# Patient Record
Sex: Male | Born: 1970 | Race: Black or African American | Hispanic: No | Marital: Married | Smoking: Current every day smoker
Health system: Southern US, Community
[De-identification: ages and names within clinical notes are randomized; demographics above are authoritative.]

## PROBLEM LIST (undated history)

## (undated) DIAGNOSIS — I10 Essential (primary) hypertension: Secondary | ICD-10-CM

## (undated) HISTORY — PX: FOOT SURGERY: SHX648

---

## 2015-03-11 ENCOUNTER — Emergency Department: Payer: PRIVATE HEALTH INSURANCE

## 2015-03-11 ENCOUNTER — Encounter: Payer: Self-pay | Admitting: *Deleted

## 2015-03-11 ENCOUNTER — Emergency Department
Admission: EM | Admit: 2015-03-11 | Discharge: 2015-03-11 | Disposition: A | Discharge status: Home / Self Care | Payer: PRIVATE HEALTH INSURANCE | Attending: Student | Admitting: Student

## 2015-03-11 DIAGNOSIS — Y9289 Other specified places as the place of occurrence of the external cause: Secondary | ICD-10-CM | POA: Insufficient documentation

## 2015-03-11 DIAGNOSIS — S8001XA Contusion of right knee, initial encounter: Secondary | ICD-10-CM | POA: Insufficient documentation

## 2015-03-11 DIAGNOSIS — Y998 Other external cause status: Secondary | ICD-10-CM | POA: Diagnosis not present

## 2015-03-11 DIAGNOSIS — Y9355 Activity, bike riding: Secondary | ICD-10-CM | POA: Insufficient documentation

## 2015-03-11 DIAGNOSIS — F17200 Nicotine dependence, unspecified, uncomplicated: Secondary | ICD-10-CM | POA: Insufficient documentation

## 2015-03-11 DIAGNOSIS — S8991XA Unspecified injury of right lower leg, initial encounter: Secondary | ICD-10-CM | POA: Diagnosis present

## 2015-03-11 DIAGNOSIS — M25461 Effusion, right knee: Secondary | ICD-10-CM | POA: Diagnosis not present

## 2015-03-11 MED ORDER — KETOROLAC TROMETHAMINE 60 MG/2ML IM SOLN
60.0000 mg | Freq: Once | INTRAMUSCULAR | Status: AC
  Administered 2015-03-11: 60 mg via INTRAMUSCULAR
  Filled 2015-03-11: qty 2

## 2015-03-11 MED ORDER — OXYCODONE-ACETAMINOPHEN 5-325 MG PO TABS: 1.0000 | ORAL_TABLET | ORAL | Status: DC | PRN

## 2015-03-11 MED ORDER — HYDROMORPHONE HCL 1 MG/ML IJ SOLN
1.0000 mg | Freq: Once | INTRAMUSCULAR | Status: AC
  Administered 2015-03-11: 1 mg via INTRAMUSCULAR
  Filled 2015-03-11: qty 1

## 2015-03-11 MED ORDER — IBUPROFEN 800 MG PO TABS: 800.0000 mg | ORAL_TABLET | Freq: Three times a day (TID) | ORAL | Status: DC | PRN
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
## 2015-05-31 ENCOUNTER — Encounter: Payer: Self-pay | Admitting: Podiatry

## 2015-05-31 ENCOUNTER — Ambulatory Visit (INDEPENDENT_AMBULATORY_CARE_PROVIDER_SITE_OTHER): Payer: BLUE CROSS/BLUE SHIELD | Admitting: Podiatry

## 2015-05-31 VITALS — BP 139/82 | HR 74 | Resp 16

## 2015-05-31 DIAGNOSIS — M2040 Other hammer toe(s) (acquired), unspecified foot: Secondary | ICD-10-CM

## 2015-05-31 DIAGNOSIS — L84 Corns and callosities: Secondary | ICD-10-CM

## 2015-05-31 DIAGNOSIS — M898X0 Other specified disorders of bone, multiple sites: Secondary | ICD-10-CM

## 2015-05-31 NOTE — Progress Notes (Signed)
   Subjective:    Patient ID: Rickey Wiley, male    DOB: 02/28/1970, 45 y.o.   MRN: [id number]  HPI    Review of Systems  Musculoskeletal: Positive for gait problem.       Objective:   Physical Exam        Assessment & Plan:

## 2015-05-31 NOTE — Patient Instructions (Signed)
Pre-Operative Instructions  Congratulations, you have decided to take an important step to improving your quality of life.  You can be assured that the doctors of [facility] will be with you every step of the way.  1. Plan to be at the surgery center/hospital at least 1 (one) hour prior to your scheduled time unless otherwise directed by the surgical center/hospital staff.  You must have a responsible adult accompany you, remain during the surgery and drive you home.  Make sure you have directions to the surgical center/hospital and know how to get there on time. 2. For hospital based surgery you will need to obtain a history and physical form from your family physician within 1 month prior to the date of surgery- we will give you a form for you primary physician.  3. We make every effort to accommodate the date you request for surgery.  There are however, times where surgery dates or times have to be moved.  We will contact you as soon as possible if a change in schedule is required.   4. No Aspirin/Ibuprofen for one week before surgery.  If you are on aspirin, any non-steroidal anti-inflammatory medications (Mobic, Aleve, Ibuprofen) you should stop taking it 7 days prior to your surgery.  You make take Tylenol  For pain prior to surgery.  5. Medications- If you are taking daily heart and blood pressure medications, seizure, reflux, allergy, asthma, anxiety, pain or diabetes medications, make sure the surgery center/hospital is aware before the day of surgery so they may notify you which medications to take or avoid the day of surgery. 6. No food or drink after midnight the night before surgery unless directed otherwise by surgical center/hospital staff. 7. No alcoholic beverages 24 hours prior to surgery.  No smoking 24 hours prior to or 24 hours after surgery. 8. Wear loose pants or shorts- loose enough to fit over bandages, boots, and casts. 9. No slip on shoes, sneakers are best. 10. Bring  your boot with you to the surgery center/hospital.  Also bring crutches or a walker if your physician has prescribed it for you.  If you do not have this equipment, it will be provided for you after surgery. 11. If you have not been contracted by the surgery center/hospital by the day before your surgery, call to confirm the date and time of your surgery. 12. Leave-time from work may vary depending on the type of surgery you have.  Appropriate arrangements should be made prior to surgery with your employer. 13. Prescriptions will be provided immediately following surgery by your doctor.  Have these filled as soon as possible after surgery and take the medication as directed. 14. Remove nail polish on the operative foot. 15. Wash the night before surgery.  The night before surgery wash the foot and leg well with the antibacterial soap provided and water paying special attention to beneath the toenails and in between the toes.  Rinse thoroughly with water and dry well with a towel.  Perform this wash unless told not to do so by your physician.  Enclosed: 1 Ice pack (please put in freezer the night before surgery)   1 Hibiclens skin cleaner   Pre-op Instructions  If you have any questions regarding the instructions, do not hesitate to call our office.  [place]: [address] [place], [place] [address] [phone]  [place]: [address] [place], [place] [address] [phone]  [place]: [address]  [place], [place] [address] [phone]   Dr.   Alonah Lineback DPM, Dr. Matthew Wagoner DPM, Dr. M. Todd Hyatt DPM, Dr. Titorya Stover DPM 

## 2015-06-03 NOTE — Progress Notes (Signed)
Subjective:     Patient ID: Rickey Wiley, male   DOB: [birth year], 45 y.o.   MRN: [id number]  HPI patient presents with a irritated fourth toe left foot that has been operated on previously with lesion formation and inability to walk on the fifth toe comfortably. Patient states that it becomes very aggravated and he tries to trim it but every couple weeks it's regrowing and becoming more painful   Review of Systems  All other systems reviewed and are negative.      Objective:   Physical Exam  Constitutional: He is oriented to person, place, and time.  Cardiovascular: Intact distal pulses.   Musculoskeletal: Normal range of motion.  Neurological: He is oriented to person, place, and time.  Skin: Skin is warm.  Nursing note and vitals reviewed.  neurovascular status found to be intact with muscle strength adequate range of motion within normal limits with patient noted to have on the plantar portion of the fourth interspace left irritated scar tissue-like lesion which may have been due to previous surgery that had been formed several years ago. Patient also seems to have some bone prominence around this area but it's difficult to ascertain as to what it is and it is very tender when pressed. Patient has good digital perfusion and is well oriented 3     Assessment:     Probable scar tissue with probable prominent bone formation underneath it and irritation with previous surgery as complicating factor    Plan:     H&P and x-ray reviewed with patient. At this point I have recommended a wide excision of this area with review and evaluation of the underlying tissue with consideration for bone spur removal. Patient wants to get this done and is going to try to get it done soon on his calendar and he will schedule this procedure. Since she's come from a fairly far distance I did allow him to go over his consent form today and I reviewed with him at great length there is absolutely no guarantees this  will solve his problem and all possible complications and alternative treatments as listed. Patient wants surgery signed consent form and will call to schedule date and is encouraged to call with any questions  Port indicates there may be some prominence in this area but again it's very difficult to make a complete determination as to the pathology until we can remove the skin wedge

## 2015-06-11 DIAGNOSIS — M2040 Other hammer toe(s) (acquired), unspecified foot: Secondary | ICD-10-CM

## 2015-06-25 ENCOUNTER — Encounter: Payer: Self-pay | Admitting: Podiatry

## 2015-06-25 DIAGNOSIS — D492 Neoplasm of unspecified behavior of bone, soft tissue, and skin: Secondary | ICD-10-CM | POA: Diagnosis not present

## 2015-06-25 DIAGNOSIS — M25775 Osteophyte, left foot: Secondary | ICD-10-CM | POA: Diagnosis not present

## 2015-06-26 ENCOUNTER — Telehealth: Payer: Self-pay | Admitting: *Deleted

## 2015-06-26 NOTE — Telephone Encounter (Addendum)
Post op courtesy call-Unable to leave a message, " The mailbox is full and unable to accept any messages at this time."  06/26/2015-[employer] called for information concerning pt's DOS, out-of-work period, [phone].  07/08/2015-Pt's wife states they were told about a week ago, pt could get his foot shower wet, but they're going to the beach in a week and need to know if he can get in the ocean or a pool.

## 2015-07-03 ENCOUNTER — Encounter: Payer: Self-pay | Admitting: Podiatry

## 2015-07-03 ENCOUNTER — Ambulatory Visit (INDEPENDENT_AMBULATORY_CARE_PROVIDER_SITE_OTHER): Payer: BLUE CROSS/BLUE SHIELD | Admitting: Podiatry

## 2015-07-03 ENCOUNTER — Ambulatory Visit (INDEPENDENT_AMBULATORY_CARE_PROVIDER_SITE_OTHER): Payer: BLUE CROSS/BLUE SHIELD

## 2015-07-03 VITALS — Temp 96.3°F

## 2015-07-03 DIAGNOSIS — M2040 Other hammer toe(s) (acquired), unspecified foot: Secondary | ICD-10-CM | POA: Diagnosis not present

## 2015-07-03 DIAGNOSIS — Z9889 Other specified postprocedural states: Secondary | ICD-10-CM

## 2015-07-03 NOTE — Progress Notes (Signed)
Subjective:     Patient ID: Rickey Wiley, male   DOB: [birth year], 45 y.o.   MRN: [id number]  HPI patient states that he's feeling well with his left foot   Review of Systems     Objective:   Physical Exam Neurovascular status intact with incision site healing well plantar left with stitches intact and bone structure looking good    Assessment:     Doing well post excision of lesion with condylectomy left fourth metatarsal digit    Plan:     X-rays reviewed with patient and advised on continued elevation and reduced weightbearing on the forefoot. Reappoint 2 weeks stitch removal or earlier if needed  X-ray show satisfactory appearance with no pathology

## 2015-07-05 ENCOUNTER — Other Ambulatory Visit: Payer: Self-pay

## 2015-07-10 ENCOUNTER — Encounter: Payer: Self-pay | Admitting: Podiatry

## 2015-07-10 ENCOUNTER — Ambulatory Visit (INDEPENDENT_AMBULATORY_CARE_PROVIDER_SITE_OTHER): Payer: BLUE CROSS/BLUE SHIELD

## 2015-07-10 ENCOUNTER — Ambulatory Visit (INDEPENDENT_AMBULATORY_CARE_PROVIDER_SITE_OTHER): Payer: BLUE CROSS/BLUE SHIELD | Admitting: Podiatry

## 2015-07-10 VITALS — BP 105/102 | HR 77 | Resp 16

## 2015-07-10 DIAGNOSIS — M79672 Pain in left foot: Secondary | ICD-10-CM

## 2015-07-10 MED ORDER — CEPHALEXIN 500 MG PO CAPS: 500.0000 mg | ORAL_CAPSULE | Freq: Two times a day (BID) | ORAL | Status: DC

## 2015-07-10 NOTE — Progress Notes (Signed)
Subjective:     Patient ID: Rickey Wiley, male   DOB: 02/03/1970, 45 y.o.   MRN: [id number]  HPI patient presents stating I just wanted to get this checked to make sure it is not draining it   Review of Systems     Objective:   Physical Exam Neurovascular status intact with incision site plantar left fourth metatarsal that is doing well with wound edges well coapted with slight irritation in the interspace    Assessment:     Appears to be a localized process with no systemic elements with no active drainage    Plan:     As a precautionary measure I did go ahead today and placed on antibiotic cephalexin 5 number milligrams twice a day and we will wait till next week to pull the stitches out and patient is to let us know if there is any other issues

## 2015-07-11 ENCOUNTER — Ambulatory Visit: Payer: BLUE CROSS/BLUE SHIELD | Admitting: Podiatry

## 2015-07-19 ENCOUNTER — Ambulatory Visit (INDEPENDENT_AMBULATORY_CARE_PROVIDER_SITE_OTHER): Payer: BLUE CROSS/BLUE SHIELD | Admitting: Podiatry

## 2015-07-19 VITALS — Temp 98.8°F

## 2015-07-19 DIAGNOSIS — M2040 Other hammer toe(s) (acquired), unspecified foot: Secondary | ICD-10-CM

## 2015-07-19 DIAGNOSIS — Z9889 Other specified postprocedural states: Secondary | ICD-10-CM | POA: Diagnosis not present

## 2015-07-19 DIAGNOSIS — Q786 Multiple congenital exostoses: Secondary | ICD-10-CM

## 2015-07-19 MED ORDER — HYDROCODONE-ACETAMINOPHEN 10-325 MG PO TABS: 1.0000 | ORAL_TABLET | Freq: Three times a day (TID) | ORAL | Status: DC | PRN

## 2015-07-19 MED ORDER — HYDROCODONE-ACETAMINOPHEN 5-325 MG PO TABS: 1.0000 | ORAL_TABLET | Freq: Four times a day (QID) | ORAL | Status: DC | PRN

## 2015-07-19 NOTE — Progress Notes (Signed)
Subjective:     Patient ID: Rickey Wiley, male   DOB: [birth year], 45 y.o.   MRN: [id number]  HPI th this patient presents the office 3 weeks status post foot surgery for the removal of an unknown soft tissue mass from the bottom of his left foot. He states that there is an occasional pain noted at the surgical site, but he feels he his healing well.  He presents the office today stating he was scheduled to have his sutures removed at this visit.     Review of Systems     Objective:   Physical Exam neurovascular status is intact. There was good wound coaptation and sutures are intact. No evidence of any swelling, redness or infection or drainage noted. There is hyperkeratotic tissue noted at the surgical site     Assessment:     S/p foot surgery     Plan:     ROV  Removal of sutures.  Patient was told to continue to wear his surgical shoe until he is completely pain free. He is to return the office for another follow-up visit in 2 weeks with Dr. Laurence Aly DPM

## 2015-08-09 ENCOUNTER — Ambulatory Visit (INDEPENDENT_AMBULATORY_CARE_PROVIDER_SITE_OTHER): Payer: BLUE CROSS/BLUE SHIELD | Admitting: Podiatry

## 2015-08-09 ENCOUNTER — Ambulatory Visit (INDEPENDENT_AMBULATORY_CARE_PROVIDER_SITE_OTHER): Payer: BLUE CROSS/BLUE SHIELD

## 2015-08-09 ENCOUNTER — Encounter: Payer: Self-pay | Admitting: Podiatry

## 2015-08-09 DIAGNOSIS — Z9889 Other specified postprocedural states: Secondary | ICD-10-CM

## 2015-08-09 DIAGNOSIS — M2040 Other hammer toe(s) (acquired), unspecified foot: Secondary | ICD-10-CM

## 2015-08-11 NOTE — Progress Notes (Signed)
Subjective:     Patient ID: Rickey Wiley, male   DOB: 1970-04-14, 45 y.o.   MRN: [id number]  HPI patient states I'm doing pretty well overall and I'm still having some discomfort but it seems to be improving   Review of Systems     Objective:   Physical Exam Neurovascular status intact negative Homans sign noted with crusted area plantar aspect fourth metatarsal with minimal edema no drainage    Assessment:     Doing well post procedure but continues to have scar tissue which is not surprising given the significance of the preoperative process    Plan:     H&P and condition reviewed and x-ray reviewed. At this point I have recommended debridement which was accomplished gradual return to activity and hopeful return to work in 2 weeks  X-ray report indicated satisfactory section of bone with no indication of pathology

## 2015-08-21 ENCOUNTER — Telehealth: Payer: Self-pay | Admitting: Podiatry

## 2015-08-21 ENCOUNTER — Encounter: Payer: Self-pay | Admitting: Podiatry

## 2015-08-21 MED ORDER — HYDROCODONE-ACETAMINOPHEN 5-325 MG PO TABS: 1.0000 | ORAL_TABLET | Freq: Four times a day (QID) | ORAL | Status: DC | PRN

## 2015-08-21 NOTE — Telephone Encounter (Signed)
Pt's wife called for more pain medication for pt. Dr. Josephina Shih refill as previous.

## 2015-08-21 NOTE — Telephone Encounter (Signed)
Patient's wife called and explained since Rickey Wiley has been starting to use his foot (per Dr. Paulla Dolly) he has experienced more pain.  He is requesting more pain medication / did not specify which medication. Wife said she had left VM on nurse line.  Did not talk to [place].

## 2015-09-13 ENCOUNTER — Ambulatory Visit (INDEPENDENT_AMBULATORY_CARE_PROVIDER_SITE_OTHER): Payer: BLUE CROSS/BLUE SHIELD | Admitting: Podiatry

## 2015-09-13 ENCOUNTER — Encounter: Payer: Self-pay | Admitting: Podiatry

## 2015-09-13 ENCOUNTER — Ambulatory Visit (INDEPENDENT_AMBULATORY_CARE_PROVIDER_SITE_OTHER): Payer: BLUE CROSS/BLUE SHIELD

## 2015-09-13 DIAGNOSIS — M2040 Other hammer toe(s) (acquired), unspecified foot: Secondary | ICD-10-CM

## 2015-09-13 DIAGNOSIS — Z9889 Other specified postprocedural states: Secondary | ICD-10-CM

## 2015-09-15 NOTE — Progress Notes (Signed)
Subjective:     Patient ID: Rickey Wiley, male   DOB: [birth year], 45 y.o.   MRN: [id number]  HPI patient states he's doing well with minimal discomfort and able to walk distances without pain   Review of Systems     Objective:   Physical Exam Neurovascular status intact muscle strength adequate and patient noted to have crusted area left fourth metatarsal but still painful but improving    Assessment:     Gradual improvement from surgery left    Plan:     Deep debridement accomplished which can be done routinely and reappoint 8 weeks or earlier and reviewed x-rays  X-ray report indicated satisfactory section of bone with no apparent pathology

## 2015-10-03 ENCOUNTER — Telehealth: Payer: Self-pay | Admitting: *Deleted

## 2015-10-03 ENCOUNTER — Ambulatory Visit (INDEPENDENT_AMBULATORY_CARE_PROVIDER_SITE_OTHER): Payer: BLUE CROSS/BLUE SHIELD | Admitting: Podiatry

## 2015-10-03 DIAGNOSIS — Z9889 Other specified postprocedural states: Secondary | ICD-10-CM

## 2015-10-03 DIAGNOSIS — B079 Viral wart, unspecified: Secondary | ICD-10-CM | POA: Diagnosis not present

## 2015-10-03 DIAGNOSIS — B078 Other viral warts: Secondary | ICD-10-CM

## 2015-10-03 MED ORDER — HYDROCODONE-IBUPROFEN 5-200 MG PO TABS: 1.0000 | ORAL_TABLET | Freq: Three times a day (TID) | ORAL | 0 refills | Status: DC | PRN

## 2015-10-03 NOTE — Telephone Encounter (Addendum)
Pt's Wife, Katrina states pharmacy says it is difficult to find hydrocodone 5/200. Pharmacist at [facility] states there is no rx there for the pt, last rx filled was Hydrocodone 5/325. Katrina states the pharmacist gave it back. 10/04/2015-Reviewed Medication orders for 10/03/2015 and pt was prescribed VICOPROFEN not Vicodin. I spoke with Katrina, asked her to read the rx to me and she states it only says Hydrocodone 5/200. Katrina then stated she would go get the rx and see what it says. I called [facility] [phone] and Debe Coder states she is not sure what the Pharmacist from yesterday told her,but they only have 10 Vicoprofen, and will direct pt's wife to a pharmacy with enough to fill pt's rx if she comes in. I spoke to the pharmacist at pt's [facility] on pt's phone and she states no pharmacy carries the Vicoprofen 5/200, but they do carry 5/300.  I told Dr. Paulla Dolly of the problem, he ordered Hydrocodone 5/325 #30 one tablet every 6 hours. Pt's wife is coming to [place] to pick up the rx.

## 2015-10-03 NOTE — Progress Notes (Signed)
Subjective:     Patient ID: Rickey Wiley, male   DOB: [birth year], 45 y.o.   MRN: [id number]  HPI patient presents stating I'm still having a lesion on the bottom of his left foot and I feel like it has improved a little bit but is still sore   Review of Systems     Objective:   Physical Exam Neurovascular status intact with muscle strength adequate and lesion plantar aspect left fourth metatarsal that upon debridement continues to show keratotic tissue and small amount of pinpoint bleeding    Assessment:     Possibility for some verruca tissue within lesion    Plan:     Debrided tissue with sterile instrumentation applied chemical agent and instructed on sterile dressing. Patient will be seen back to recheck

## 2015-10-04 MED ORDER — HYDROCODONE-ACETAMINOPHEN 5-325 MG PO TABS: 1.0000 | ORAL_TABLET | Freq: Four times a day (QID) | ORAL | 0 refills | Status: DC | PRN

## 2015-10-10 NOTE — Progress Notes (Signed)
DOS 05.23.2017 1. Removal skin lesion from bottom of left foot 2. Removal of bone spur bottom left foot

## 2015-10-31 ENCOUNTER — Encounter: Payer: Self-pay | Admitting: Podiatry

## 2015-10-31 ENCOUNTER — Ambulatory Visit (INDEPENDENT_AMBULATORY_CARE_PROVIDER_SITE_OTHER): Payer: BLUE CROSS/BLUE SHIELD | Admitting: Podiatry

## 2015-10-31 DIAGNOSIS — B070 Plantar wart: Secondary | ICD-10-CM | POA: Diagnosis not present

## 2015-10-31 DIAGNOSIS — B079 Viral wart, unspecified: Secondary | ICD-10-CM

## 2015-10-31 DIAGNOSIS — M2040 Other hammer toe(s) (acquired), unspecified foot: Secondary | ICD-10-CM

## 2015-10-31 DIAGNOSIS — B078 Other viral warts: Secondary | ICD-10-CM

## 2015-10-31 NOTE — Progress Notes (Signed)
Subjective:     Patient ID: Rickey Wiley, male   DOB: September 20, 1970, 45 y.o.   MRN: [id number]  HPI patient states my left foot is improving with lesion that seems to be getting better with previous medicine   Review of Systems     Objective:   Physical Exam Neurovascular status intact with keratotic lesion sub-fourth metatarsal left that upon debridement shows pinpoint bleeding and pain to lateral pressure    Assessment:     Verruca plantaris left with the other rest the incision site healing very well    Plan:     Debrided the lesion carefully cleaned up all tissue and applied chemical agent to create an immune response and applied sterile dressing. Reappoint to recheck

## 2015-12-12 ENCOUNTER — Ambulatory Visit: Payer: BLUE CROSS/BLUE SHIELD | Admitting: Podiatry

## 2016-02-06 ENCOUNTER — Emergency Department: Payer: PRIVATE HEALTH INSURANCE

## 2016-02-06 ENCOUNTER — Encounter: Payer: Self-pay | Admitting: Intensive Care

## 2016-02-06 DIAGNOSIS — J209 Acute bronchitis, unspecified: Secondary | ICD-10-CM | POA: Insufficient documentation

## 2016-02-06 DIAGNOSIS — F17200 Nicotine dependence, unspecified, uncomplicated: Secondary | ICD-10-CM | POA: Insufficient documentation

## 2016-02-06 LAB — CBC
HCT: 51.6 % (ref 40.0–52.0)
Hemoglobin: 17.3 g/dL (ref 13.0–18.0)
MCH: 29.4 pg (ref 26.0–34.0)
MCHC: 33.5 g/dL (ref 32.0–36.0)
MCV: 87.7 fL (ref 80.0–100.0)
PLATELETS: 195 10*3/uL (ref 150–440)
RBC: 5.89 MIL/uL (ref 4.40–5.90)
RDW: 14.1 % (ref 11.5–14.5)
WBC: 7.5 10*3/uL (ref 3.8–10.6)

## 2016-02-06 LAB — BASIC METABOLIC PANEL
Anion gap: 7 (ref 5–15)
BUN: 11 mg/dL (ref 6–20)
CALCIUM: 9.4 mg/dL (ref 8.9–10.3)
CO2: 27 mmol/L (ref 22–32)
Chloride: 103 mmol/L (ref 101–111)
Creatinine, Ser: 1.13 mg/dL (ref 0.61–1.24)
GFR calc non Af Amer: >60 mL/min (ref >60)
Glucose, Bld: 111 mg/dL — ABNORMAL HIGH (ref 65–99)
Potassium: 3.9 mmol/L (ref 3.5–5.1)
SODIUM: 137 mmol/L (ref 135–145)

## 2016-02-06 LAB — TROPONIN I: Troponin I: <0.03 ng/mL (ref <0.03)

## 2016-02-06 NOTE — ED Triage Notes (Signed)
Patient presents to ER for central chest tightness X2 days. No radiation States the pain started upon awakening. Productive Cough for a few days with clear sputum. A&O x4. Ambulatory in triage. NAD noted

## 2016-02-07 ENCOUNTER — Emergency Department
Admission: EM | Admit: 2016-02-07 | Discharge: 2016-02-07 | Disposition: A | Discharge status: Home / Self Care | Payer: PRIVATE HEALTH INSURANCE | Attending: Emergency Medicine | Admitting: Emergency Medicine

## 2016-02-07 DIAGNOSIS — J209 Acute bronchitis, unspecified: Secondary | ICD-10-CM

## 2016-02-07 MED ORDER — PREDNISONE 20 MG PO TABS: 60.0000 mg | ORAL_TABLET | Freq: Every day | ORAL | 0 refills | Status: AC

## 2016-02-07 MED ORDER — AZITHROMYCIN 250 MG PO TABS: ORAL_TABLET | ORAL | 0 refills | Status: AC

## 2016-02-07 MED ORDER — PREDNISONE 20 MG PO TABS
60.0000 mg | ORAL_TABLET | Freq: Once | ORAL | Status: AC
  Administered 2016-02-07: 60 mg via ORAL
  Filled 2016-02-07: qty 3

## 2016-02-07 MED ORDER — AZITHROMYCIN 500 MG PO TABS
500.0000 mg | ORAL_TABLET | Freq: Once | ORAL | Status: AC
  Administered 2016-02-07: 500 mg via ORAL
  Filled 2016-02-07: qty 1

## 2016-02-07 MED ORDER — IPRATROPIUM-ALBUTEROL 0.5-2.5 (3) MG/3ML IN SOLN
3.0000 mL | Freq: Once | RESPIRATORY_TRACT | Status: AC
  Administered 2016-02-07: 3 mL via RESPIRATORY_TRACT
  Filled 2016-02-07: qty 3

## 2016-02-07 MED ORDER — ALBUTEROL SULFATE HFA 108 (90 BASE) MCG/ACT IN AERS: 2.0000 | INHALATION_SPRAY | Freq: Four times a day (QID) | RESPIRATORY_TRACT | 2 refills | Status: DC | PRN

## 2016-02-07 NOTE — ED Notes (Signed)

## 2016-02-07 NOTE — ED Provider Notes (Signed)
[facility] Emergency Department Provider Note    First MD Initiated Contact with Patient 02/07/16 0113     (approximate)  I have reviewed the triage vital signs and the nursing notes.   HISTORY  Chief Complaint Chest Pain    HPI Joe Fazzina is a 46 y.o. male presents with her doctor cough congestion and chest tightness 2 days. Patient states that his chest discomfort is improved after coughing. Patient admits to daily tobacco use approximately one pack cigarettes. She denies any fever afebrile on presentation with temperature 90.8. Patient denies any A pain or swelling   Past medical history No pertinent past medical history There are no active problems to display for this patient.   Past surgical history None  Prior to Admission medications   Medication Sig Start Date End Date Taking? Authorizing Provider  HYDROcodone-acetaminophen (NORCO/VICODIN) 5-325 MG tablet Take 1 tablet by mouth every 6 (six) hours as needed for moderate pain. 10/04/15   Wallene Huh, DPM  ibuprofen (ADVIL,MOTRIN) 800 MG tablet Take 1 tablet (800 mg total) by mouth every 8 (eight) hours as needed. 03/11/15   Sable Feil, PA-C    Allergies Patient has no known allergies.  History reviewed. No pertinent family history.  Social History Social History  Substance Use Topics  . Smoking status: Current Every Day Smoker  . Smokeless tobacco: Not on file  . Alcohol use Yes    Review of Systems Constitutional: No fever/chills Eyes: No visual changes. ENT: No sore throat. Cardiovascular: Denies chest pain. Respiratory: Denies shortness of breath. Gastrointestinal: No abdominal pain.  No nausea, no vomiting.  No diarrhea.  No constipation. Genitourinary: Negative for dysuria. Musculoskeletal: Negative for back pain. Skin: Negative for rash. Neurological: Negative for headaches, focal weakness or numbness.  10-point ROS otherwise  negative.  ____________________________________________   PHYSICAL EXAM:  VITAL SIGNS: ED Triage Vitals [02/06/16 1846]  Enc Vitals Group     BP (!) 154/89     Pulse Rate 70     Resp 18     Temp 98 F (36.7 C)     Temp Source Oral     SpO2 97 %     Weight 220 lb (99.8 kg)     Height 5\' 11"  (1.803 m)     Head Circumference      Peak Flow      Pain Score 5     Pain Loc      Pain Edu?      Excl. in [place]?     Constitutional: Alert and oriented. Well appearing and in no acute distress. Eyes: Conjunctivae are normal. PERRL. EOMI. Head: Atraumatic. Mouth/Throat: Mucous membranes are moist.  Oropharynx non-erythematous. Neck: No stridor.  No meningeal signs. Cardiovascular: Normal rate, regular rhythm. Good peripheral circulation. Grossly normal heart sounds. Respiratory: Normal respiratory effort.  No retractions.Diffuse mild expiratory wheezes  Gastrointestinal: Soft and nontender. No distention.  Musculoskeletal: No lower extremity tenderness nor edema. No gross deformities of extremities. Neurologic:  Normal speech and language. No gross focal neurologic deficits are appreciated.  Skin:  Skin is warm, dry and intact. No rash noted. Psychiatric: Mood and affect are normal. Speech and behavior are normal.  ____________________________________________   LABS (all labs ordered are listed, but only abnormal results are displayed)  Labs Reviewed  BASIC METABOLIC PANEL - Abnormal; Notable for the following:       Result Value   Glucose, Bld 111 (*)    All other components within  normal limits  CBC  TROPONIN I   ____________________________________________  EKG  ED ECG REPORT I, Prentiss N Zamir Staples, the attending physician, personally viewed and interpreted this ECG.   Date: 02/07/2016  EKG Time: 6:44 PM  Rate: 72  Rhythm: Normal sinus rhythm  Axis: Normal  Intervals: Normal  ST&T Change: None  ____________________________________________  RADIOLOGY I, Elderton N  Dalal Livengood, personally viewed and evaluated these images (plain radiographs) as part of my medical decision making, as well as reviewing the written report by the radiologist.  Dg Chest 2 View  Result Date: 02/06/2016 CLINICAL DATA:  Central chest pain beginning 3 days ago, worsening today. EXAM: CHEST  2 VIEW COMPARISON:  None. FINDINGS: Heart and mediastinal contours are within normal limits. No focal opacities or effusions. No acute bony abnormality. IMPRESSION: No active cardiopulmonary disease. Electronically Signed   By: Rolm Baptise M.D.   On: 02/06/2016 19:23      Procedures     INITIAL IMPRESSION / ASSESSMENT AND PLAN / ED COURSE  Pertinent labs & imaging results that were available during my care of the patient were reviewed by me and considered in my medical decision making (see chart for details).  Patient given a DuoNeb, azithromycin and prednisone in the emergency department. History of physical exam consistent acute bronchitis patient will be prescribed albuterol inhaler and prednisone and azithromycin for home.   Clinical Course     ____________________________________________  FINAL CLINICAL IMPRESSION(S) / ED DIAGNOSES  Final diagnoses:  Acute bronchitis, unspecified organism     MEDICATIONS GIVEN DURING THIS VISIT:  Medications  ipratropium-albuterol (DUONEB) 0.5-2.5 (3) MG/3ML nebulizer solution 3 mL (not administered)  predniSONE (DELTASONE) tablet 60 mg (not administered)  azithromycin (ZITHROMAX) tablet 500 mg (not administered)     NEW OUTPATIENT MEDICATIONS STARTED DURING THIS VISIT:  New Prescriptions   No medications on file    Modified Medications   No medications on file    Discontinued Medications   No medications on file     Note:  This document was prepared using Dragon voice recognition software and may include unintentional dictation errors.    Gregor Hams, MD 02/07/16 [phone]

## 2016-07-20 ENCOUNTER — Encounter: Payer: Self-pay | Admitting: Emergency Medicine

## 2016-07-20 ENCOUNTER — Emergency Department
Admission: EM | Admit: 2016-07-20 | Discharge: 2016-07-20 | Disposition: A | Discharge status: Home / Self Care | Payer: Commercial Managed Care - PPO | Attending: Emergency Medicine | Admitting: Emergency Medicine

## 2016-07-20 DIAGNOSIS — S20469A Insect bite (nonvenomous) of unspecified back wall of thorax, initial encounter: Secondary | ICD-10-CM | POA: Diagnosis not present

## 2016-07-20 DIAGNOSIS — Y939 Activity, unspecified: Secondary | ICD-10-CM | POA: Diagnosis not present

## 2016-07-20 DIAGNOSIS — W57XXXA Bitten or stung by nonvenomous insect and other nonvenomous arthropods, initial encounter: Secondary | ICD-10-CM | POA: Diagnosis not present

## 2016-07-20 DIAGNOSIS — Y999 Unspecified external cause status: Secondary | ICD-10-CM | POA: Diagnosis not present

## 2016-07-20 DIAGNOSIS — Y929 Unspecified place or not applicable: Secondary | ICD-10-CM | POA: Insufficient documentation

## 2016-07-20 MED ORDER — TRIAMCINOLONE ACETONIDE 0.1 % EX CREA
1.0000 | TOPICAL_CREAM | Freq: Four times a day (QID) | CUTANEOUS | 0 refills | Status: DC
Start: 2016-07-20 — End: 2018-05-26

## 2016-07-20 MED ORDER — DOXYCYCLINE HYCLATE 100 MG PO TABS: 100.0000 mg | ORAL_TABLET | Freq: Two times a day (BID) | ORAL | 0 refills | Status: DC

## 2016-07-20 NOTE — ED Triage Notes (Signed)
Patient ambulatory to triage with steady gait, without difficulty or distress noted; pt reports tick removed from back 2 days ago, now would like site checked because he was told by coworker that "it looked a little rough"; st itching at site, scab noted with slight redness

## 2016-07-20 NOTE — ED Notes (Signed)
Pt verbalizes understanding of discharge instructions.

## 2016-07-20 NOTE — ED Provider Notes (Signed)
[facility] Emergency Department Provider Note  ____________________________________________  Time seen: Approximately 9:53 PM  I have reviewed the triage vital signs and the nursing notes.   HISTORY  Chief Complaint Insect Bite    HPI Rickey Wiley is a 46 y.o. male who presents to emergency department complaining of tick bite to his back. Patient reports that tick was on for approximately one day before mood. He reports good complete removal. Since then he has had some swelling and itching to the area. Patient is unable to visualize area but is concerned that he may have a skin infection and is requesting antibiotics. Patient denies any systemic complaints of fevers or chills, arthralgias, myalgias, rashes.   History reviewed. No pertinent past medical history.  There are no active problems to display for this patient.   Past Surgical History:  Procedure Laterality Date  . FOOT SURGERY Left     Prior to Admission medications   Medication Sig Start Date End Date Taking? Authorizing Provider  albuterol (PROVENTIL HFA;VENTOLIN HFA) 108 (90 Base) MCG/ACT inhaler Inhale 2 puffs into the lungs every 6 (six) hours as needed for wheezing or shortness of breath. 02/07/16   Gregor Hams, MD  doxycycline (VIBRA-TABS) 100 MG tablet Take 1 tablet (100 mg total) by mouth 2 (two) times daily. 07/20/16   Cuthriell, Charline Bills, PA-C  HYDROcodone-acetaminophen (NORCO/VICODIN) 5-325 MG tablet Take 1 tablet by mouth every 6 (six) hours as needed for moderate pain. 10/04/15   Wallene Huh, DPM  ibuprofen (ADVIL,MOTRIN) 800 MG tablet Take 1 tablet (800 mg total) by mouth every 8 (eight) hours as needed. 03/11/15   Sable Feil, PA-C  triamcinolone cream (KENALOG) 0.1 % Apply 1 application topically 4 (four) times daily. 07/20/16   Cuthriell, Charline Bills, PA-C    Allergies Patient has no known allergies.  No family history on file.  Social History Social History   Substance Use Topics  . Smoking status: Current Every Day Smoker  . Smokeless tobacco: Never Used  . Alcohol use Yes     Review of Systems  Constitutional: No fever/chills Eyes: No visual changes.  Cardiovascular: no chest pain. Respiratory: no cough. No SOB. Gastrointestinal: No abdominal pain.  No nausea, no vomiting.  No diarrhea.  No constipation. Musculoskeletal: Negative for musculoskeletal pain. Skin: Negative for rash, abrasions, lacerations, ecchymosis.Tick bite to the back. Neurological: Negative for headaches, focal weakness or numbness. 10-point ROS otherwise negative.  ____________________________________________   PHYSICAL EXAM:  VITAL SIGNS: ED Triage Vitals [07/20/16 2124]  Enc Vitals Group     BP (!) 144/97     Pulse Rate 92     Resp 18     Temp 98.5 F (36.9 C)     Temp Source Oral     SpO2 97 %     Weight 215 lb (97.5 kg)     Height 5\' 11"  (1.803 m)     Head Circumference      Peak Flow      Pain Score      Pain Loc      Pain Edu?      Excl. in [place]?      Constitutional: Alert and oriented. Well appearing and in no acute distress. Eyes: Conjunctivae are normal. PERRL. EOMI. Head: Atraumatic. ENT:      Ears:       Nose: No congestion/rhinnorhea.      Mouth/Throat: Mucous membranes are moist.  Neck: No stridor.   Hematological/Lymphatic/Immunilogical: No cervical  lymphadenopathy. Cardiovascular: Normal rate, regular rhythm. Normal S1 and S2.  Good peripheral circulation. Respiratory: Normal respiratory effort without tachypnea or retractions. Lungs CTAB. Good air entry to the bases with no decreased or absent breath sounds. Musculoskeletal: Full range of motion to all extremities. No gross deformities appreciated. Neurologic:  Normal speech and language. No gross focal neurologic deficits are appreciated.  Skin:  Skin is warm, dry and intact. No rash noted.Central lesion with surrounding erythema and edema. Total erythema and edema measures  approximately 4 cm in diameter. Area is nontender to palpation. No drainage. No fluctuance or induration. Psychiatric: Mood and affect are normal. Speech and behavior are normal. Patient exhibits appropriate insight and judgement.   ____________________________________________   LABS (all labs ordered are listed, but only abnormal results are displayed)  Labs Reviewed - No data to display ____________________________________________  EKG   ____________________________________________  RADIOLOGY   No results found.  ____________________________________________    PROCEDURES  Procedure(s) performed:    Procedures    Medications - No data to display   ____________________________________________   INITIAL IMPRESSION / ASSESSMENT AND PLAN / ED COURSE  Pertinent labs & imaging results that were available during my care of the patient were reviewed by me and considered in my medical decision making (see chart for details).  Review of the  CSRS was performed in accordance of the [place] prior to dispensing any controlled drugs.     Patient's diagnosis is consistent with infected tick bite. Patient suffered a tick bite and was able to successfully remove a complete tick. This time, no indication of recommended spinal fever or Lyme's disease requiring titers. Patient will be treated for mild cellulitis with doxycycline as this will cover tick borne illness as well as cellulitis. Patient will follow primary care as needed. Patient is given ED precautions to return to the ED for any worsening or new symptoms.     ____________________________________________  FINAL CLINICAL IMPRESSION(S) / ED DIAGNOSES  Final diagnoses:  Tick bite, initial encounter      NEW MEDICATIONS STARTED DURING THIS VISIT:  New Prescriptions   DOXYCYCLINE (VIBRA-TABS) 100 MG TABLET    Take 1 tablet (100 mg total) by mouth 2 (two) times daily.   TRIAMCINOLONE CREAM (KENALOG) 0.1 %     Apply 1 application topically 4 (four) times daily.        This chart was dictated using voice recognition software/Dragon. Despite best efforts to proofread, errors can occur which can change the meaning. Any change was purely unintentional.    Darletta Moll, PA-C 07/20/16 2228    Hinda Kehr, MD 07/20/16 2248

## 2016-12-07 ENCOUNTER — Encounter: Payer: Self-pay | Admitting: Emergency Medicine

## 2016-12-07 ENCOUNTER — Other Ambulatory Visit: Payer: Self-pay

## 2016-12-07 ENCOUNTER — Emergency Department
Admission: EM | Admit: 2016-12-07 | Discharge: 2016-12-07 | Disposition: A | Discharge status: Home / Self Care | Payer: Commercial Managed Care - PPO | Attending: Emergency Medicine | Admitting: Emergency Medicine

## 2016-12-07 DIAGNOSIS — F17210 Nicotine dependence, cigarettes, uncomplicated: Secondary | ICD-10-CM | POA: Diagnosis not present

## 2016-12-07 DIAGNOSIS — L02214 Cutaneous abscess of groin: Secondary | ICD-10-CM | POA: Diagnosis not present

## 2016-12-07 DIAGNOSIS — R2242 Localized swelling, mass and lump, left lower limb: Secondary | ICD-10-CM | POA: Diagnosis present

## 2016-12-07 MED ORDER — SULFAMETHOXAZOLE-TRIMETHOPRIM 800-160 MG PO TABS
1.0000 | ORAL_TABLET | Freq: Once | ORAL | Status: AC
  Administered 2016-12-07: 1 via ORAL
  Filled 2016-12-07: qty 1

## 2016-12-07 MED ORDER — SULFAMETHOXAZOLE-TRIMETHOPRIM 800-160 MG PO TABS: 1.0000 | ORAL_TABLET | Freq: Two times a day (BID) | ORAL | 0 refills | Status: DC

## 2016-12-07 MED ORDER — OXYCODONE-ACETAMINOPHEN 5-325 MG PO TABS: 1.0000 | ORAL_TABLET | Freq: Four times a day (QID) | ORAL | 0 refills | Status: DC | PRN

## 2016-12-07 MED ORDER — OXYCODONE-ACETAMINOPHEN 5-325 MG PO TABS
1.0000 | ORAL_TABLET | Freq: Once | ORAL | Status: AC
  Administered 2016-12-07: 1 via ORAL
  Filled 2016-12-07: qty 1

## 2016-12-07 MED ORDER — LIDOCAINE HCL (PF) 1 % IJ SOLN
10.0000 mL | Freq: Once | INTRAMUSCULAR | Status: AC
  Administered 2016-12-07: 5 mL
  Filled 2016-12-07: qty 10

## 2016-12-07 NOTE — ED Triage Notes (Signed)
Painful raised area L suprapubic, no drainage.

## 2016-12-07 NOTE — ED Provider Notes (Signed)
[facility] Emergency Department Provider Note  ____________________________________________  Time seen: Approximately 7:20 PM  I have reviewed the triage vital signs and the nursing notes.   HISTORY  Chief Complaint Abscess    HPI Rickey Concannon Sr. is a 46 y.o. male who presents the emergency department complaining of a "boil" to the left groin.  Patient reports that he noticed symptoms first 2 days prior.  It is gotten larger over the past 2 days.  Patient tried using a warm hot compress to the area to promote ahead for drainage but states that it is not drained.  Area is very tender to palpation.  No medications prior to arrival.  Patient states that he has a remote history of similar symptoms to the same region in the past.  It resolved without treatment.  No other complaints at this time.  History reviewed. No pertinent past medical history.  There are no active problems to display for this patient.   Past Surgical History:  Procedure Laterality Date  . FOOT SURGERY Left     Prior to Admission medications   Medication Sig Start Date End Date Taking? Authorizing Provider  albuterol (PROVENTIL HFA;VENTOLIN HFA) 108 (90 Base) MCG/ACT inhaler Inhale 2 puffs into the lungs every 6 (six) hours as needed for wheezing or shortness of breath. 02/07/16   Gregor Hams, MD  doxycycline (VIBRA-TABS) 100 MG tablet Take 1 tablet (100 mg total) by mouth 2 (two) times daily. 07/20/16   Eddi Hymes, Charline Bills, PA-C  HYDROcodone-acetaminophen (NORCO/VICODIN) 5-325 MG tablet Take 1 tablet by mouth every 6 (six) hours as needed for moderate pain. 10/04/15   Wallene Huh, DPM  ibuprofen (ADVIL,MOTRIN) 800 MG tablet Take 1 tablet (800 mg total) by mouth every 8 (eight) hours as needed. 03/11/15   Sable Feil, PA-C  oxyCODONE-acetaminophen (ROXICET) 5-325 MG tablet Take 1 tablet every 6 (six) hours as needed by mouth for severe pain. 12/07/16   Kerston Landeck, Charline Bills, PA-C   sulfamethoxazole-trimethoprim (BACTRIM DS,SEPTRA DS) 800-160 MG tablet Take 1 tablet 2 (two) times daily by mouth. 12/07/16   Yehoshua Vitelli, Charline Bills, PA-C  triamcinolone cream (KENALOG) 0.1 % Apply 1 application topically 4 (four) times daily. 07/20/16   Javarious Elsayed, Charline Bills, PA-C    Allergies Patient has no known allergies.  No family history on file.  Social History Social History   Tobacco Use  . Smoking status: Current Every Day Smoker    Packs/day: 0.50    Types: Cigarettes  . Smokeless tobacco: Never Used  Substance Use Topics  . Alcohol use: Yes  . Drug use: Not on file     Review of Systems  Constitutional: No fever/chills Eyes: No visual changes.  Cardiovascular: no chest pain. Respiratory: no cough. No SOB. Gastrointestinal: No abdominal pain.  No nausea, no vomiting.   Musculoskeletal: Negative for musculoskeletal pain. Skin: Negative for rash, abrasions, lacerations, ecchymosis.  Positive for "boil" to the left groin Neurological: Negative for headaches, focal weakness or numbness. 10-point ROS otherwise negative.  ____________________________________________   PHYSICAL EXAM:  VITAL SIGNS: ED Triage Vitals [12/07/16 1813]  Enc Vitals Group     BP (!) 162/108     Pulse Rate 63     Resp 18     Temp 98.2 F (36.8 C)     Temp Source Oral     SpO2 98 %     Weight 220 lb (99.8 kg)     Height 5' 11.5" (1.816 m)  Head Circumference      Peak Flow      Pain Score 10     Pain Loc      Pain Edu?      Excl. in [place]?      Constitutional: Alert and oriented. Well appearing and in no acute distress. Eyes: Conjunctivae are normal. PERRL. EOMI. Head: Atraumatic. Neck: No stridor.    Cardiovascular: Normal rate, regular rhythm. Normal S1 and S2.  Good peripheral circulation. Respiratory: Normal respiratory effort without tachypnea or retractions. Lungs CTAB. Good air entry to the bases with no decreased or absent breath sounds. Gastrointestinal: Bowel  sounds 4 quadrants. Soft and nontender to palpation. No guarding or rigidity. No palpable masses. No distention. No CVA tenderness. Musculoskeletal: Full range of motion to all extremities. No gross deformities appreciated. Neurologic:  Normal speech and language. No gross focal neurologic deficits are appreciated.  Skin:  Skin is warm, dry and intact. No rash noted.  Edematous and edematous skin lesion noted to the left groin.  Area is fluctuant with induration.  No drainage at this time.  It is very tender to palpation.  No regional lymphadenopathy is appreciated. Psychiatric: Mood and affect are normal. Speech and behavior are normal. Patient exhibits appropriate insight and judgement.   ____________________________________________   LABS (all labs ordered are listed, but only abnormal results are displayed)  Labs Reviewed - No data to display ____________________________________________  EKG   ____________________________________________  RADIOLOGY   No results found.  ____________________________________________    PROCEDURES  Procedure(s) performed:    Marland KitchenMarland KitchenIncision and Drainage Date/Time: 12/07/2016 8:33 PM Performed by: Darletta Moll, PA-C Authorized by: Darletta Moll, PA-C   Consent:    Consent obtained:  Verbal   Consent given by:  Patient   Risks discussed:  Bleeding, incomplete drainage and pain Location:    Type:  Abscess   Size:  5cm diameter   Location:  Anogenital   Anogenital location: L inguinal region. Pre-procedure details:    Skin preparation:  Betadine Anesthesia (see MAR for exact dosages):    Anesthesia method:  Local infiltration   Local anesthetic:  Lidocaine 1% w/o epi Procedure type:    Complexity:  Simple Procedure details:    Needle aspiration: no     Incision types:  Single straight   Incision depth:  Subcutaneous   Scalpel blade:  11   Wound management:  Probed and deloculated and irrigated with saline    Drainage:  Bloody and purulent   Drainage amount:  Scant   Wound treatment:  Wound left open   Packing materials:  None Post-procedure details:    Patient tolerance of procedure:  Tolerated well, no immediate complications Comments:     Area is cleansed using Betadine.  Local infiltration percent lidocaine.  A single straight incision with mild drainage.  Area is superficial and does not require packing.  Area is covered with petroleum gauze and Tegaderm      Medications  lidocaine (PF) (XYLOCAINE) 1 % injection 10 mL (not administered)  sulfamethoxazole-trimethoprim (BACTRIM DS,SEPTRA DS) 800-160 MG per tablet 1 tablet (not administered)  oxyCODONE-acetaminophen (PERCOCET/ROXICET) 5-325 MG per tablet 1 tablet (not administered)     ____________________________________________   INITIAL IMPRESSION / ASSESSMENT AND PLAN / ED COURSE  Pertinent labs & imaging results that were available during my care of the patient were reviewed by me and considered in my medical decision making (see chart for details).  Review of the [facility] CSRS was performed in accordance  of the [place] prior to dispensing any controlled drugs.     Patient's diagnosis is consistent with left groin abscess.  This is incised and drained as described above.  Patient tolerated well with no complications.  Wound care instructions are given to patient.  Patient is given first dose of antibiotic and pain medication in the emergency department.. Patient will be discharged home with prescriptions for Bactrim and limited prescription of pain medication. Patient is to follow up with primary care as needed or otherwise directed. Patient is given ED precautions to return to the ED for any worsening or new symptoms.     ____________________________________________  FINAL CLINICAL IMPRESSION(S) / ED DIAGNOSES  Final diagnoses:  Abscess of left groin      NEW MEDICATIONS STARTED DURING THIS VISIT:  Bactrim DS, 1 tablet  twice daily Roxicet, 1 tablet every 4-6 hours as needed for pain, dispense #10      This chart was dictated using voice recognition software/Dragon. Despite best efforts to proofread, errors can occur which can change the meaning. Any change was purely unintentional.    Darletta Moll, PA-C 12/07/16 2037    Orbie Pyo, MD 12/07/16 [phone]

## 2017-03-15 ENCOUNTER — Ambulatory Visit (INDEPENDENT_AMBULATORY_CARE_PROVIDER_SITE_OTHER): Payer: BLUE CROSS/BLUE SHIELD | Admitting: Podiatry

## 2017-03-15 ENCOUNTER — Encounter: Payer: Self-pay | Admitting: Podiatry

## 2017-03-15 DIAGNOSIS — L84 Corns and callosities: Secondary | ICD-10-CM

## 2017-03-15 NOTE — Progress Notes (Signed)
This patient presents the office with chief complaint of a painful callus between the fourth and fifth toes of his right foot.  He says that this is a frequent problem which she usually is able to work on himself.  He says this corn has become painful while and he is unable to self treat.  He gives a history of having the same problem between the fourth and fifth toes on the left foot.  This corn was surgically corrected by Dr. Paulla Dolly.  He presents the office today for an evaluation and treatment on his painful corn, right foot.   General Appearance  Alert, conversant and in no acute stress.  Vascular  Dorsalis pedis and posterior pulses are palpable  bilaterally.  Capillary return is within normal limits  bilaterally. Temperature is within normal limits  Bilaterally.  Neurologic  Senn-Weinstein monofilament wire test within normal limits  bilaterally. Muscle power within normal limits bilaterally.  Nails Thick disfigured discolored nails multiple digits.  Orthopedic  No limitations of motion of motion feet bilaterally.  No crepitus or effusions noted.  No bony pathology or digital deformities noted.  Skin  Syndactaly 4/5 left foot.  Heloma molle 4/5 right foot with no infection noted.  Heloma molle 4/5 right foot.  ROV  Debride corn 4/5 right foot.  Discuss this problem with this patient and recommended he seek a surgical consult with Dr. Paulla Dolly for the correction of this painful heloma molle, right foot   Gardiner Barefoot DPM

## 2017-11-18 ENCOUNTER — Ambulatory Visit: Payer: Self-pay | Admitting: Family Medicine

## 2018-01-20 ENCOUNTER — Ambulatory Visit: Payer: Self-pay | Admitting: Family Medicine

## 2018-03-09 ENCOUNTER — Ambulatory Visit: Payer: Self-pay | Admitting: Physician Assistant

## 2018-03-22 ENCOUNTER — Ambulatory Visit: Payer: Self-pay | Admitting: Physician Assistant

## 2018-05-26 ENCOUNTER — Encounter: Payer: Self-pay | Admitting: Podiatry

## 2018-05-26 ENCOUNTER — Ambulatory Visit: Payer: BLUE CROSS/BLUE SHIELD | Admitting: Podiatry

## 2018-05-26 ENCOUNTER — Other Ambulatory Visit: Payer: Self-pay

## 2018-05-26 VITALS — Temp 96.4°F

## 2018-05-26 DIAGNOSIS — Q828 Other specified congenital malformations of skin: Secondary | ICD-10-CM | POA: Diagnosis not present

## 2018-05-26 NOTE — Progress Notes (Signed)
This patient presents the office with chief complaint of a painful callus uncer his left foot.  He says he has pain for over 1 year with him now walking on his heel the last month.  He has provided no self treatment.  He presents for evaluation and treatment.   General Appearance  Alert, conversant and in no acute stress.  Vascular  Dorsalis pedis and posterior pulses are palpable  bilaterally.  Capillary return is within normal limits  bilaterally. Temperature is within normal limits  Bilaterally.  Neurologic  Senn-Weinstein monofilament wire test within normal limits  bilaterally. Muscle power within normal limits bilaterally.  Nails Thick disfigured discolored nails multiple digits.  Orthopedic  No limitations of motion of motion feet bilaterally.  No crepitus or effusions noted.  No bony pathology or digital deformities noted. Palpable pain sub 4 metatarsal left foot.  Skin  Syndactaly 4/5 left foot.  Heloma molle 4/5 right foot with no infection noted.  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  Porokeratosis sub 4 left foot.  Capsulitis 4th met left foot.  ROV  Debride porokeratosis.  Discuss this problem with this patient and discussed injection therapy left forefoot.  Prescribe biofreeze.  RTC 6 months.   Gardiner Barefoot DPM

## 2018-07-15 ENCOUNTER — Other Ambulatory Visit: Payer: Self-pay

## 2018-07-15 ENCOUNTER — Encounter: Payer: Self-pay | Admitting: Emergency Medicine

## 2018-07-15 ENCOUNTER — Emergency Department
Admission: EM | Admit: 2018-07-15 | Discharge: 2018-07-15 | Disposition: A | Discharge status: Home / Self Care | Payer: BC Managed Care – PPO | Attending: Emergency Medicine | Admitting: Emergency Medicine

## 2018-07-15 DIAGNOSIS — F17210 Nicotine dependence, cigarettes, uncomplicated: Secondary | ICD-10-CM | POA: Insufficient documentation

## 2018-07-15 DIAGNOSIS — L089 Local infection of the skin and subcutaneous tissue, unspecified: Secondary | ICD-10-CM | POA: Insufficient documentation

## 2018-07-15 DIAGNOSIS — L723 Sebaceous cyst: Secondary | ICD-10-CM | POA: Diagnosis not present

## 2018-07-15 DIAGNOSIS — R221 Localized swelling, mass and lump, neck: Secondary | ICD-10-CM | POA: Diagnosis present

## 2018-07-15 MED ORDER — LIDOCAINE-EPINEPHRINE (PF) 2 %-1:200000 IJ SOLN
10.0000 mL | Freq: Once | INTRAMUSCULAR | Status: AC
  Administered 2018-07-15: 10 mL via INTRADERMAL
  Filled 2018-07-15: qty 10

## 2018-07-15 MED ORDER — IBUPROFEN 600 MG PO TABS: 600.0000 mg | ORAL_TABLET | Freq: Three times a day (TID) | ORAL | 0 refills | Status: DC | PRN

## 2018-07-15 MED ORDER — DOXYCYCLINE HYCLATE 100 MG PO CAPS: 100.0000 mg | ORAL_CAPSULE | Freq: Two times a day (BID) | ORAL | 0 refills | Status: AC

## 2018-07-15 NOTE — ED Provider Notes (Signed)
[facility] Emergency Department Provider Note  ____________________________________________   First MD Initiated Contact with Patient 07/15/18 [phone]     (approximate)  I have reviewed the triage vital signs and the nursing notes.   HISTORY  Chief Complaint Neck Pain    HPI Rickey Walth Sr. is a 48 y.o. male here with recurrent swelling to the right posterior lateral neck.  The patient states that he has had a area of swelling to the right posterior lateral neck that has been present for years.  States that intermittently will get "swollen" and he has to take antibiotics.  He states that over the last 2 to 3 days, he said increased swelling and mild pain at the site.  He said some mild redness.  He states the pain is worse with any kind of movement of his neck or palpation.  No fevers or chills.  No drainage.  No neck pain or neck stiffness.  He is not immune suppressed.  No headache.  No alleviating factors other than rest.  He has not tried to drain it on his own.  He has not seen a dermatologist or surgical specialist for this.        History reviewed. No pertinent past medical history.  There are no active problems to display for this patient.   Past Surgical History:  Procedure Laterality Date  . FOOT SURGERY Left     Prior to Admission medications   Medication Sig Start Date End Date Taking? Authorizing Provider  doxycycline (VIBRAMYCIN) 100 MG capsule Take 1 capsule (100 mg total) by mouth 2 (two) times daily for 7 days. 07/15/18 07/22/18  Duffy Bruce, MD  ibuprofen (ADVIL) 600 MG tablet Take 1 tablet (600 mg total) by mouth every 8 (eight) hours as needed for moderate pain (back pain). 07/15/18   Duffy Bruce, MD    Allergies Patient has no known allergies.  No family history on file.  Social History Social History   Tobacco Use  . Smoking status: Current Every Day Smoker    Packs/day: 0.50    Types: Cigarettes  . Smokeless  tobacco: Never Used  Substance Use Topics  . Alcohol use: Yes  . Drug use: Not on file    Review of Systems Review of Systems  Constitutional: Negative for chills, fatigue and fever.  HENT: Negative for congestion and rhinorrhea.   Eyes: Negative for visual disturbance.  Respiratory: Negative for cough and shortness of breath.   Gastrointestinal: Negative for abdominal pain, diarrhea, nausea and vomiting.  Genitourinary: Negative for dysuria and flank pain.  Musculoskeletal: Positive for neck pain.  Skin: Positive for rash and wound.  Neurological: Negative for weakness and light-headedness.  All other systems reviewed and are negative.    ____________________________________________  PHYSICAL EXAM:  VITAL SIGNS: ED Triage Vitals  Enc Vitals Group     BP 07/15/18 0747 (!) 160/104     Pulse Rate 07/15/18 0747 87     Resp 07/15/18 0747 16     Temp 07/15/18 0747 98.1 F (36.7 C)     Temp Source 07/15/18 0747 Oral     SpO2 07/15/18 0747 96 %     Weight 07/15/18 0745 245 lb (111.1 kg)     Height 07/15/18 0745 5' 11.5" (1.816 m)     Head Circumference --      Peak Flow --      Pain Score 07/15/18 0745 6     Pain Loc --  Pain Edu? --      Excl. in [place]? --     Physical Exam Vitals signs and nursing note reviewed.  Constitutional:      General: He is not in acute distress.    Appearance: He is well-developed.  HENT:     Head: Normocephalic and atraumatic.  Eyes:     Conjunctiva/sclera: Conjunctivae normal.  Neck:     Musculoskeletal: Neck supple.     Comments: Small, fluctuant, mildly tender subcutaneous swelling to the right posterior lateral neck.  Old I&D scar noted.  Minimal overlying erythema with no tenderness beyond areas of erythema.  No crepitance. Cardiovascular:     Rate and Rhythm: Normal rate and regular rhythm.     Heart sounds: Normal heart sounds. No murmur. No friction rub.  Pulmonary:     Effort: Pulmonary effort is normal. No respiratory  distress.     Breath sounds: Normal breath sounds. No wheezing or rales.  Abdominal:     General: There is no distension.     Palpations: Abdomen is soft.     Tenderness: There is no abdominal tenderness.  Skin:    General: Skin is warm.     Capillary Refill: Capillary refill takes less than 2 seconds.  Neurological:     Mental Status: He is alert and oriented to person, place, and time.     Motor: No abnormal muscle tone.       ____________________________________________   LABS (all labs ordered are listed, but only abnormal results are displayed)  Labs Reviewed - No data to display  ____________________________________________  EKG: NA ________________________________________  RADIOLOGY All imaging, including plain films, CT scans, and ultrasounds, independently reviewed by me, and interpretations confirmed via formal radiology reads.  ED MD interpretation:  NA  Official radiology report(s): No results found.  ____________________________________________  PROCEDURES   Procedure(s) performed (including Critical Care):  Marland KitchenMarland KitchenIncision and Drainage  Date/Time: 07/15/2018 9:09 AM Performed by: Duffy Bruce, MD Authorized by: Duffy Bruce, MD   Consent:    Consent obtained:  Verbal   Consent given by:  Patient   Risks discussed:  Bleeding, damage to other organs, incomplete drainage, infection and pain   Alternatives discussed:  Alternative treatment and delayed treatment Location:    Type:  Abscess   Size:  1 x 2   Location:  Neck   Neck location:  R posterior Pre-procedure details:    Skin preparation:  Betadine Anesthesia (see MAR for exact dosages):    Anesthesia method:  Local infiltration   Local anesthetic:  Lidocaine 1% WITH epi Procedure type:    Complexity:  Simple Procedure details:    Incision types:  Single straight   Incision depth:  Dermal   Scalpel blade:  11   Wound management:  Probed and deloculated and irrigated with saline    Drainage:  Purulent   Drainage amount:  Moderate   Wound treatment:  Wound left open   Packing materials:  1/4 in iodoform gauze Post-procedure details:    Patient tolerance of procedure:  Tolerated well, no immediate complications Ultrasound ED Soft Tissue  Date/Time: 07/15/2018 9:10 AM Performed by: Duffy Bruce, MD Authorized by: Duffy Bruce, MD   Procedure details:    Indications: localization of abscess and evaluate for cellulitis     Transverse view:  Visualized   Longitudinal view:  Visualized   Images: archived     Limitations:  Positioning Location:    Location: neck     Side:  Right  Findings:     abscess present    no cellulitis present    no foreign body present    ____________________________________________  INITIAL IMPRESSION / MDM / ASSESSMENT AND PLAN / ED COURSE  As part of my medical decision making, I reviewed the following data within the [place] Notes from prior ED visits and [place] Controlled Substance Database      *Damen Windsor Sr. was evaluated in Emergency Department on 07/15/2018 for the symptoms described in the history of present illness. He was evaluated in the context of the global COVID-19 pandemic, which necessitated consideration that the patient might be at risk for infection with the SARS-CoV-2 virus that causes COVID-19. Institutional protocols and algorithms that pertain to the evaluation of patients at risk for COVID-19 are in a state of rapid change based on information released by regulatory bodies including the CDC and federal and state organizations. These policies and algorithms were followed during the patient's care in the ED.  Some ED evaluations and interventions may be delayed as a result of limited staffing during the pandemic.*      Medical Decision Making: 48 year old male here with recurrent, likely infected sebaceous cyst with abscess to the right posterior lateral neck.  No fever or signs of systemic  infection.  Abscess ultrasounded to confirm that it was not a lymph node, and I&D performed at bedside.  Will place the patient on empiric antibiotics and discharged with outpatient referral for likely cyst removal in the future.  ____________________________________________  FINAL CLINICAL IMPRESSION(S) / ED DIAGNOSES  Final diagnoses:  Infected sebaceous cyst     MEDICATIONS GIVEN DURING THIS VISIT:  Medications  lidocaine-EPINEPHrine (XYLOCAINE W/EPI) 2 %-1:200000 (PF) injection 10 mL (10 mLs Intradermal Given by Other 07/15/18 1583)     ED Discharge Orders         Ordered    doxycycline (VIBRAMYCIN) 100 MG capsule  2 times daily     07/15/18 0912    ibuprofen (ADVIL) 600 MG tablet  Every 8 hours PRN     07/15/18 0912           Note:  This document was prepared using Dragon voice recognition software and may include unintentional dictation errors.   Duffy Bruce, MD 07/15/18 [phone]

## 2018-07-15 NOTE — ED Triage Notes (Signed)
Patient presents to the ED with pain and swelling to the right side of his neck since yesterday evening.  Patient reports having a lump on his neck previously but states area is getting more painful and worse.

## 2018-07-15 NOTE — Discharge Instructions (Signed)
You have been seen in the Emergency Department (ED) today for an abscess.  This was drained in the ED.  Please follow up with your doctor or in the ED in 24-48 hours for recheck of your wound.  Read through the additional discharge instructions included below regarding wound care recommendations.  Call your doctor sooner or return to the ED if you develop worsening signs of infection such as: increased redness, increased pain, pus, or fever.

## 2018-07-15 NOTE — ED Notes (Signed)
Pt has firm mass on right posterior neck. Pt states that he mass has been there for years, however, he noticed yesterday that it had increased in size and it is now painful. Pt reports trouble turning his neck to the right due to pain. Pt also reports trouble sleeping last night due to pain. Pt denies fever or chills.

## 2018-09-02 ENCOUNTER — Other Ambulatory Visit: Payer: Self-pay

## 2018-09-02 ENCOUNTER — Emergency Department: Payer: BC Managed Care – PPO

## 2018-09-02 ENCOUNTER — Emergency Department
Admission: EM | Admit: 2018-09-02 | Discharge: 2018-09-02 | Disposition: A | Discharge status: Home / Self Care | Payer: BC Managed Care – PPO | Attending: Emergency Medicine | Admitting: Emergency Medicine

## 2018-09-02 DIAGNOSIS — T148XXA Other injury of unspecified body region, initial encounter: Secondary | ICD-10-CM

## 2018-09-02 DIAGNOSIS — Z79899 Other long term (current) drug therapy: Secondary | ICD-10-CM | POA: Insufficient documentation

## 2018-09-02 DIAGNOSIS — X509XXA Other and unspecified overexertion or strenuous movements or postures, initial encounter: Secondary | ICD-10-CM | POA: Insufficient documentation

## 2018-09-02 DIAGNOSIS — Y929 Unspecified place or not applicable: Secondary | ICD-10-CM | POA: Insufficient documentation

## 2018-09-02 DIAGNOSIS — Y9389 Activity, other specified: Secondary | ICD-10-CM | POA: Insufficient documentation

## 2018-09-02 DIAGNOSIS — Y999 Unspecified external cause status: Secondary | ICD-10-CM | POA: Insufficient documentation

## 2018-09-02 DIAGNOSIS — F17210 Nicotine dependence, cigarettes, uncomplicated: Secondary | ICD-10-CM | POA: Diagnosis not present

## 2018-09-02 DIAGNOSIS — S39012A Strain of muscle, fascia and tendon of lower back, initial encounter: Secondary | ICD-10-CM | POA: Diagnosis not present

## 2018-09-02 DIAGNOSIS — S3982XA Other specified injuries of lower back, initial encounter: Secondary | ICD-10-CM | POA: Diagnosis present

## 2018-09-02 LAB — URINALYSIS, COMPLETE (UACMP) WITH MICROSCOPIC
Bacteria, UA: NONE SEEN
Bilirubin Urine: NEGATIVE
Glucose, UA: NEGATIVE mg/dL
Ketones, ur: NEGATIVE mg/dL
Leukocytes,Ua: NEGATIVE
Nitrite: NEGATIVE
Protein, ur: NEGATIVE mg/dL
Specific Gravity, Urine: 1.017 (ref 1.005–1.030)
pH: 5 (ref 5.0–8.0)

## 2018-09-02 MED ORDER — IBUPROFEN 800 MG PO TABS: 800.0000 mg | ORAL_TABLET | Freq: Three times a day (TID) | ORAL | 0 refills | Status: DC | PRN

## 2018-09-02 MED ORDER — LIDOCAINE 5 % EX PTCH
1.0000 | MEDICATED_PATCH | CUTANEOUS | Status: DC
  Administered 2018-09-02: 14:00:00 1 via TRANSDERMAL
  Filled 2018-09-02: qty 1

## 2018-09-02 MED ORDER — CYCLOBENZAPRINE HCL 10 MG PO TABS: 10.0000 mg | ORAL_TABLET | Freq: Three times a day (TID) | ORAL | 0 refills | Status: DC | PRN

## 2018-09-02 NOTE — ED Provider Notes (Signed)
[facility] Emergency Department Provider Note   ____________________________________________   First MD Initiated Contact with Patient 09/02/18 1204     (approximate)  I have reviewed the triage vital signs and the nursing notes.   HISTORY  Chief Complaint Back Pain    HPI Rickey Copley Sr. is a 48 y.o. male patient complaining of left flank and rib pain secondary to coughing spell last night.  Patient states certain movements aggravate the pain.  Patient denies dyspnea.  Patient states no longer coughing.  Patient rates pain as a 5/10.  Patient described pain as "achy".  No palliative measure for complaint.         History reviewed. No pertinent past medical history.  There are no active problems to display for this patient.   Past Surgical History:  Procedure Laterality Date  . FOOT SURGERY Left     Prior to Admission medications   Medication Sig Start Date End Date Taking? Authorizing Provider  cyclobenzaprine (FLEXERIL) 10 MG tablet Take 1 tablet (10 mg total) by mouth 3 (three) times daily as needed. 09/02/18   Sable Feil, PA-C  ibuprofen (ADVIL) 600 MG tablet Take 1 tablet (600 mg total) by mouth every 8 (eight) hours as needed for moderate pain (back pain). 07/15/18   Duffy Bruce, MD  ibuprofen (ADVIL) 800 MG tablet Take 1 tablet (800 mg total) by mouth every 8 (eight) hours as needed for moderate pain. 09/02/18   Sable Feil, PA-C    Allergies Patient has no known allergies.  No family history on file.  Social History Social History   Tobacco Use  . Smoking status: Current Every Day Smoker    Packs/day: 0.50    Types: Cigarettes  . Smokeless tobacco: Never Used  Substance Use Topics  . Alcohol use: Yes  . Drug use: Not on file    Review of Systems Constitutional: No fever/chills Eyes: No visual changes. ENT: No sore throat. Cardiovascular: Left lateral chest wall pain. Respiratory: Denies shortness of breath.  Gastrointestinal: No abdominal pain.  No nausea, no vomiting.  No diarrhea.  No constipation. Genitourinary: Negative for dysuria. Musculoskeletal: Positive for back pain. Skin: Negative for rash. Neurological: Negative for headaches, focal weakness or numbness.   ____________________________________________   PHYSICAL EXAM:  VITAL SIGNS: ED Triage Vitals [09/02/18 1148]  Enc Vitals Group     BP (!) 142/90     Pulse Rate 68     Resp 18     Temp 99.6 F (37.6 C)     Temp Source Oral     SpO2 100 %     Weight 225 lb (102.1 kg)     Height 5\' 11"  (1.803 m)     Head Circumference      Peak Flow      Pain Score 5     Pain Loc      Pain Edu?      Excl. in [place]?    Constitutional: Alert and oriented. Well appearing and in no acute distress. Neck: No stridor.  No cervical spine tenderness to palpation. Hematological/Lymphatic/Immunilogical: No cervical lymphadenopathy. Cardiovascular: Normal rate, regular rhythm. Grossly normal heart sounds.  Good peripheral circulation. Respiratory: Normal respiratory effort.  No retractions. Lungs CTAB. Gastrointestinal: Soft and nontender. No distention. No abdominal bruits.  Left CVA tenderness. Musculoskeletal: No lower extremity tenderness nor edema.  No joint effusions. Neurologic:  Normal speech and language. No gross focal neurologic deficits are appreciated. No gait instability. Skin:  Skin is warm, dry and intact. No rash noted. Psychiatric: Mood and affect are normal. Speech and behavior are normal.  ____________________________________________   LABS (all labs ordered are listed, but only abnormal results are displayed)  Labs Reviewed  URINALYSIS, COMPLETE (UACMP) WITH MICROSCOPIC - Abnormal; Notable for the following components:      Result Value   Color, Urine YELLOW (*)    APPearance CLEAR (*)    Hgb urine dipstick SMALL (*)    All other components within normal limits   ____________________________________________  EKG    ____________________________________________  RADIOLOGY  ED MD interpretation:    Official radiology report(s): Dg Chest 2 View  Result Date: 09/02/2018 CLINICAL DATA:  Left-sided back and rib pain following a big cough last night. EXAM: CHEST - 2 VIEW COMPARISON:  02/06/2016 FINDINGS: Cardiac silhouette is normal in size. No mediastinal or hilar masses. There is no evidence of adenopathy. Clear lungs.  No pleural effusion or pneumothorax. Skeletal structures are intact.  No evidence of a rib fracture. IMPRESSION: No active cardiopulmonary disease. Electronically Signed   By: Lajean Manes M.D.   On: 09/02/2018 13:02    ____________________________________________   PROCEDURES  Procedure(s) performed (including Critical Care):  Procedures   ____________________________________________   INITIAL IMPRESSION / ASSESSMENT AND PLAN / ED COURSE  As part of my medical decision making, I reviewed the following data within the electronic MEDICAL RECORD NUMBER        Rickey Vanriper Sr. was evaluated in Emergency Department on 09/02/2018 for the symptoms described in the history of present illness. He was evaluated in the context of the global COVID-19 pandemic, which necessitated consideration that the patient might be at risk for infection with the SARS-CoV-2 virus that causes COVID-19. Institutional protocols and algorithms that pertain to the evaluation of patients at risk for COVID-19 are in a state of rapid change based on information released by regulatory bodies including the CDC and federal and state organizations. These policies and algorithms were followed during the patient's care in the ED.   Patient presents with left flank pain secondary to a coughing spell last night.  Patient physical exam was grossly unremarkable except for mild guarding in the left CVA area.  Discussed negative urinalysis and chest x-ray findings with patient.  Patient given discharge care instructions advised  take medication as directed.  Patient asked for open-door clinic if condition persists.     ____________________________________________   FINAL CLINICAL IMPRESSION(S) / ED DIAGNOSES  Final diagnoses:  Muscle strain     ED Discharge Orders         Ordered    ibuprofen (ADVIL) 800 MG tablet  Every 8 hours PRN     09/02/18 1343    cyclobenzaprine (FLEXERIL) 10 MG tablet  3 times daily PRN     09/02/18 1343           Note:  This document was prepared using Dragon voice recognition software and may include unintentional dictation errors.    Sable Feil, PA-C 09/02/18 1344    Arta Silence, MD 09/02/18 [phone]

## 2018-09-02 NOTE — ED Triage Notes (Signed)
C/o left sided back and rib pain after big cough last night. States pain with certain movements since coughing. Pt alert and oriented X4, active, cooperative, pt in NAD. RR even and unlabored, color WNL.  Denies urinary sx.

## 2018-11-28 ENCOUNTER — Ambulatory Visit: Payer: BLUE CROSS/BLUE SHIELD | Admitting: Podiatry

## 2019-03-16 DIAGNOSIS — I10 Essential (primary) hypertension: Secondary | ICD-10-CM | POA: Insufficient documentation

## 2019-03-16 DIAGNOSIS — E119 Type 2 diabetes mellitus without complications: Secondary | ICD-10-CM | POA: Insufficient documentation

## 2019-03-16 DIAGNOSIS — E785 Hyperlipidemia, unspecified: Secondary | ICD-10-CM | POA: Insufficient documentation

## 2019-03-16 DIAGNOSIS — M5442 Lumbago with sciatica, left side: Secondary | ICD-10-CM | POA: Insufficient documentation

## 2019-03-16 DIAGNOSIS — G8929 Other chronic pain: Secondary | ICD-10-CM | POA: Insufficient documentation

## 2020-09-11 ENCOUNTER — Encounter: Payer: Self-pay | Admitting: Podiatry

## 2020-09-11 ENCOUNTER — Other Ambulatory Visit: Payer: Self-pay | Admitting: Podiatry

## 2020-09-11 ENCOUNTER — Ambulatory Visit (INDEPENDENT_AMBULATORY_CARE_PROVIDER_SITE_OTHER): Payer: BC Managed Care – PPO

## 2020-09-11 ENCOUNTER — Ambulatory Visit (INDEPENDENT_AMBULATORY_CARE_PROVIDER_SITE_OTHER): Payer: BC Managed Care – PPO | Admitting: Podiatry

## 2020-09-11 ENCOUNTER — Other Ambulatory Visit: Payer: Self-pay

## 2020-09-11 DIAGNOSIS — D2372 Other benign neoplasm of skin of left lower limb, including hip: Secondary | ICD-10-CM | POA: Diagnosis not present

## 2020-09-11 DIAGNOSIS — M778 Other enthesopathies, not elsewhere classified: Secondary | ICD-10-CM | POA: Diagnosis not present

## 2020-09-11 DIAGNOSIS — M2041 Other hammer toe(s) (acquired), right foot: Secondary | ICD-10-CM

## 2020-09-11 DIAGNOSIS — M2042 Other hammer toe(s) (acquired), left foot: Secondary | ICD-10-CM

## 2020-09-11 DIAGNOSIS — M216X2 Other acquired deformities of left foot: Secondary | ICD-10-CM | POA: Diagnosis not present

## 2020-09-11 NOTE — Progress Notes (Signed)
He presents today and states that he had surgery by Dr. Felisa Bonier and 2017 and continues to have a callus on the plantar aspect of the left foot.  He has tried to trim it down but it gets really thick and throws his gait off causes his back and his hip to hurt.  Objective: Vital signs are stable alert oriented x3.  Pulses are palpable.  No erythema edema cellulitis drainage or odor toes appear to be rectus no osseous abnormalities identified on radiograph.  He does have an area of reactive hyperkeratosis of the fourth metatarsal head and just proximal to that area left foot.  I debrided this out today and enucleated that I does not demonstrate any type of infectious characteristics.  Assessment: Porokeratosis left foot.  Plan: Debridement with chemical destruction.  Placed salicylic acid under occlusion after enucleation of the benign skin lesion.  I will follow-up with him in a few weeks just to make sure this has resolved.

## 2020-12-06 ENCOUNTER — Emergency Department: Payer: BC Managed Care – PPO

## 2020-12-06 ENCOUNTER — Encounter: Payer: Self-pay | Admitting: Emergency Medicine

## 2020-12-06 ENCOUNTER — Emergency Department
Admission: EM | Admit: 2020-12-06 | Discharge: 2020-12-07 | Disposition: A | Discharge status: Home / Self Care | Payer: BC Managed Care – PPO | Attending: Emergency Medicine | Admitting: Emergency Medicine

## 2020-12-06 ENCOUNTER — Other Ambulatory Visit: Payer: Self-pay

## 2020-12-06 DIAGNOSIS — R0789 Other chest pain: Secondary | ICD-10-CM | POA: Diagnosis present

## 2020-12-06 DIAGNOSIS — I10 Essential (primary) hypertension: Secondary | ICD-10-CM | POA: Diagnosis not present

## 2020-12-06 DIAGNOSIS — Z79899 Other long term (current) drug therapy: Secondary | ICD-10-CM | POA: Insufficient documentation

## 2020-12-06 DIAGNOSIS — E119 Type 2 diabetes mellitus without complications: Secondary | ICD-10-CM | POA: Diagnosis not present

## 2020-12-06 DIAGNOSIS — Z7984 Long term (current) use of oral hypoglycemic drugs: Secondary | ICD-10-CM | POA: Diagnosis not present

## 2020-12-06 DIAGNOSIS — F17210 Nicotine dependence, cigarettes, uncomplicated: Secondary | ICD-10-CM | POA: Diagnosis not present

## 2020-12-06 LAB — D-DIMER, QUANTITATIVE: D-Dimer, Quant: 0.42 ug/mL-FEU (ref 0.00–0.50)

## 2020-12-06 LAB — CBC WITH DIFFERENTIAL/PLATELET
Abs Immature Granulocytes: 0.01 10*3/uL (ref 0.00–0.07)
Basophils Absolute: 0 10*3/uL (ref 0.0–0.1)
Basophils Relative: 1 %
Eosinophils Absolute: 0.1 10*3/uL (ref 0.0–0.5)
Eosinophils Relative: 2 %
HCT: 46.7 % (ref 39.0–52.0)
Hemoglobin: 15.8 g/dL (ref 13.0–17.0)
Immature Granulocytes: 0 %
Lymphocytes Relative: 57 %
Lymphs Abs: 2.8 10*3/uL (ref 0.7–4.0)
MCH: 30.2 pg (ref 26.0–34.0)
MCHC: 33.8 g/dL (ref 30.0–36.0)
MCV: 89.3 fL (ref 80.0–100.0)
Monocytes Absolute: 0.6 10*3/uL (ref 0.1–1.0)
Monocytes Relative: 12 %
Neutro Abs: 1.3 10*3/uL — ABNORMAL LOW (ref 1.7–7.7)
Neutrophils Relative %: 28 %
Platelets: 159 10*3/uL (ref 150–400)
RBC: 5.23 MIL/uL (ref 4.22–5.81)
RDW: 12.8 % (ref 11.5–15.5)
WBC: 4.8 10*3/uL (ref 4.0–10.5)
nRBC: 0 % (ref 0.0–0.2)

## 2020-12-06 LAB — COMPREHENSIVE METABOLIC PANEL
ALT: 24 U/L (ref 0–44)
AST: 28 U/L (ref 15–41)
Albumin: 4.4 g/dL (ref 3.5–5.0)
Alkaline Phosphatase: 63 U/L (ref 38–126)
Anion gap: 10 (ref 5–15)
BUN: 14 mg/dL (ref 6–20)
CO2: 24 mmol/L (ref 22–32)
Calcium: 9.3 mg/dL (ref 8.9–10.3)
Chloride: 103 mmol/L (ref 98–111)
Creatinine, Ser: 1.11 mg/dL (ref 0.61–1.24)
GFR, Estimated: >60 mL/min (ref >60)
Glucose, Bld: 103 mg/dL — ABNORMAL HIGH (ref 70–99)
Potassium: 4 mmol/L (ref 3.5–5.1)
Sodium: 137 mmol/L (ref 135–145)
Total Bilirubin: 0.8 mg/dL (ref 0.3–1.2)
Total Protein: 7.7 g/dL (ref 6.5–8.1)

## 2020-12-06 LAB — TROPONIN I (HIGH SENSITIVITY)
Troponin I (High Sensitivity): 11 ng/L (ref <18)
Troponin I (High Sensitivity): 12 ng/L (ref <18)

## 2020-12-06 NOTE — ED Triage Notes (Signed)
Pt here with c/o left sided cp, left upper back pain and left rib pain over the past 2 days, states occasionally hurts with palpation and movement. NAD. Denies shob.

## 2020-12-06 NOTE — ED Provider Notes (Signed)
Emergency Medicine Provider Triage Evaluation Note  Rickey Favaro Sr. , a 50 y.o. male  was evaluated in triage.  Pt complains of left side chest pain x 4 days. 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 Pain got worse today. Described as "sore pain." No cardiac history.  Review of Systems  Positive: Chest pain. Negative: Vomiting, nausea, shortness of breath  Physical Exam  There were no vitals taken for this visit. Gen:   Awake, no distress   Resp:  Normal effort  MSK:   Moves extremities without difficulty  Other:    Medical Decision Making  Medically screening exam initiated at 5:04 PM.  Appropriate orders placed.  Rickey Seltzer Sr. was informed that the remainder of the evaluation will be completed by another provider, this initial triage assessment does not replace that evaluation, and the importance of remaining in the ED until their evaluation is complete.   Victorino Dike, FNP 12/06/20 1708    Arta Silence, MD 12/06/20 1946

## 2020-12-07 MED ORDER — IBUPROFEN 800 MG PO TABS: 800.0000 mg | ORAL_TABLET | Freq: Three times a day (TID) | ORAL | 0 refills | Status: AC | PRN

## 2020-12-07 NOTE — ED Provider Notes (Signed)
[facility] Emergency Department Provider Note  ____________________________________________   Event Date/Time   First MD Initiated Contact with Patient 12/07/20 [phone]     (approximate)  I have reviewed the triage vital signs and the nursing notes.   HISTORY  Chief Complaint Chest Pain    HPI Rickey Skaggs Sr. is a 50 y.o. male with history of hypertension, diabetes, hyperlipidemia who presents to the emergency department with several days of left-sided chest pain that he describes as dull, achy that is worse with movement and better with using a heating pad.  Has not tried any medications at home but does have a prescription for baclofen.  No fevers, cough, shortness of breath, nausea, vomiting, dizziness.  No history of CAD.  No history of PE, DVT, exogenous estrogen use, recent fractures, surgery, trauma, hospitalization, prolonged travel or other immobilization. No lower extremity swelling or pain. No calf tenderness.         History reviewed. No pertinent past medical history.  Patient Active Problem List   Diagnosis Date Noted   Chronic bilateral low back pain with left-sided sciatica 03/16/2019   Controlled type 2 diabetes mellitus without complication, without long-term current use of insulin ([place]) 03/16/2019   Essential hypertension 03/16/2019   Hyperlipidemia 03/16/2019    Past Surgical History:  Procedure Laterality Date   FOOT SURGERY Left     Prior to Admission medications   Medication Sig Start Date End Date Taking? Authorizing Provider  ibuprofen (ADVIL) 800 MG tablet Take 1 tablet (800 mg total) by mouth every 8 (eight) hours as needed for mild pain. 12/07/20  Yes Jasamine Pottinger, Delice Bison, DO  baclofen (LIORESAL) 10 MG tablet Take 10 mg by mouth daily. 09/04/20   [provider]  losartan (COZAAR) 25 MG tablet Take 25 mg by mouth daily. 06/21/20   [provider]  metFORMIN (GLUCOPHAGE) 500 MG tablet Take 500 mg by mouth 2 (two)  times daily. 09/02/20   [provider]  pantoprazole (PROTONIX) 40 MG tablet Take 40 mg by mouth daily. 09/01/20   [provider]  pravastatin (PRAVACHOL) 40 MG tablet Take 40 mg by mouth at bedtime. 06/01/20   [provider]    Allergies Patient has no known allergies.  No family history on file.  Social History Social History   Tobacco Use   Smoking status: Every Day    Packs/day: 0.50    Types: Cigarettes   Smokeless tobacco: Never  Substance Use Topics   Alcohol use: Yes    Review of Systems Constitutional: No fever. Eyes: No visual changes. ENT: No sore throat. Cardiovascular:+ chest pain. Respiratory: Denies shortness of breath. Gastrointestinal: No nausea, vomiting, diarrhea. Genitourinary: Negative for dysuria. Musculoskeletal: Negative for back pain. Skin: Negative for rash. Neurological: Negative for focal weakness or numbness.  ____________________________________________   PHYSICAL EXAM:  VITAL SIGNS: ED Triage Vitals [12/06/20 1709]  Enc Vitals Group     BP (!) 154/92     Pulse Rate 70     Resp 16     Temp 98.2 F (36.8 C)     Temp Source Oral     SpO2 98 %     Weight 220 lb (99.8 kg)     Height 5\' 11"  (1.803 m)     Head Circumference      Peak Flow      Pain Score 7     Pain Loc      Pain Edu?  Excl. in [place]?    CONSTITUTIONAL: Alert and oriented and responds appropriately to questions. Well-appearing; well-nourished HEAD: Normocephalic EYES: Conjunctivae clear, pupils appear equal, EOM appear intact ENT: normal nose; moist mucous membranes NECK: Supple, normal ROM CARD: RRR; S1 and S2 appreciated; no murmurs, no clicks, no rubs, no gallops CHEST:  Chest wall is tender to palpation over the lateral and posterior chest wall.  No crepitus, ecchymosis, erythema, warmth, rash or other lesions present.   RESP: Normal chest excursion without splinting or tachypnea; breath sounds clear and equal bilaterally; no  wheezes, no rhonchi, no rales, no hypoxia or respiratory distress, speaking full sentences ABD/GI: Normal bowel sounds; non-distended; soft, non-tender, no rebound, no guarding, no peritoneal signs, no hepatosplenomegaly BACK: The back appears normal EXT: Normal ROM in all joints; no deformity noted, no edema; no cyanosis, no calf tenderness or calf swelling SKIN: Normal color for age and race; warm; no rash on exposed skin NEURO: Moves all extremities equally PSYCH: The patient's mood and manner are appropriate.  ____________________________________________   LABS (all labs ordered are listed, but only abnormal results are displayed)  Labs Reviewed  COMPREHENSIVE METABOLIC PANEL - Abnormal; Notable for the following components:      Result Value   Glucose, Bld 103 (*)    All other components within normal limits  CBC WITH DIFFERENTIAL/PLATELET - Abnormal; Notable for the following components:   Neutro Abs 1.3 (*)    All other components within normal limits  D-DIMER, QUANTITATIVE  TROPONIN I (HIGH SENSITIVITY)  TROPONIN I (HIGH SENSITIVITY)   ____________________________________________  EKG   EKG Interpretation  Date/Time:  Friday December 06 2020 17:06:52 EDT Ventricular Rate:  77 PR Interval:  144 QRS Duration: 76 QT Interval:  422 QTC Calculation: 477 R Axis:   52 Text Interpretation: Normal sinus rhythm Normal ECG Confirmed by Pryor Curia [phone]) on 12/07/2020 2:48:37 AM        ____________________________________________  RADIOLOGY Jessie Foot Myer Bohlman, personally viewed and evaluated these images (plain radiographs) as part of my medical decision making, as well as reviewing the written report by the radiologist.  ED MD interpretation: Chest x-ray clear.  Official radiology report(s): DG Chest 2 View  Result Date: 12/06/2020 CLINICAL DATA:  Left-sided pleuritic chest pain for 2 days. EXAM: CHEST - 2 VIEW COMPARISON:  09/02/2018 FINDINGS: The heart size and  mediastinal contours are within normal limits. Both lungs are clear. No evidence of pneumothorax or pleural effusion. The visualized skeletal structures are unremarkable. IMPRESSION: No active cardiopulmonary disease. Electronically Signed   By: Marlaine Hind M.D.   On: 12/06/2020 17:51    ____________________________________________   PROCEDURES  Procedure(s) performed (including Critical Care):  Procedures   ____________________________________________   INITIAL IMPRESSION / ASSESSMENT AND PLAN / ED COURSE  As part of my medical decision making, I reviewed the following data within the [place] History obtained from family, Nursing notes reviewed and incorporated, Labs reviewed , EKG interpreted , Old EKG reviewed, Old chart reviewed, Radiograph reviewed , and Notes from prior ED visits         Patient here with atypical chest pain.  Seems to be musculoskeletal in nature.  Work-up initiated in triage and shows normal blood counts, electrolytes, renal function, D-dimer and troponin x2 negative.  EKG nonischemic without arrhythmia.  Chest x-ray clear.  Reviewed by myself and radiologist.  She has no infiltrate, edema, pneumothorax.  Recommended that he continue his baclofen as prescribed at home and recommended alternating Tylenol,  Motrin.  No history of any injury to the chest wall and no sign of any rash, shingles.  I feel he is safe to be discharged home.  Patient is comfortable with this plan.  At this time, I do not feel there is any life-threatening condition present. I have reviewed, interpreted and discussed all results (EKG, imaging, lab, urine as appropriate) and exam findings with patient/family. I have reviewed nursing notes and appropriate previous records.  I feel the patient is safe to be discharged home without further emergent workup and can continue workup as an outpatient as needed. Discussed usual and customary return precautions. Patient/family  verbalize understanding and are comfortable with this plan.  Outpatient follow-up has been provided as needed. All questions have been answered.  ____________________________________________   FINAL CLINICAL IMPRESSION(S) / ED DIAGNOSES  Final diagnoses:  Atypical chest pain     ED Discharge Orders          Ordered    ibuprofen (ADVIL) 800 MG tablet  Every 8 hours PRN        12/07/20 0333            *Please note:  Rickey Seltzer Sr. was evaluated in Emergency Department on 12/07/2020 for the symptoms described in the history of present illness. He was evaluated in the context of the global COVID-19 pandemic, which necessitated consideration that the patient might be at risk for infection with the SARS-CoV-2 virus that causes COVID-19. Institutional protocols and algorithms that pertain to the evaluation of patients at risk for COVID-19 are in a state of rapid change based on information released by regulatory bodies including the CDC and federal and state organizations. These policies and algorithms were followed during the patient's care in the ED.  Some ED evaluations and interventions may be delayed as a result of limited staffing during and the pandemic.*   Note:  This document was prepared using Dragon voice recognition software and may include unintentional dictation errors.    Dhairya Corales, Delice Bison, DO 12/07/20 [phone]

## 2020-12-07 NOTE — Discharge Instructions (Addendum)
You may alternate Tylenol 1000 mg every 6 hours as needed for pain, fever and Ibuprofen 800 mg every 8 hours as needed for pain, fever.  Please take Ibuprofen with food.  Do not take more than 4000 mg of Tylenol (acetaminophen) in a 24 hour period.   Please take your baclofen as prescribed.

## 2021-01-20 ENCOUNTER — Other Ambulatory Visit: Payer: Self-pay | Admitting: Family Medicine

## 2021-01-20 DIAGNOSIS — G8929 Other chronic pain: Secondary | ICD-10-CM

## 2021-02-05 ENCOUNTER — Ambulatory Visit
Admission: RE | Admit: 2021-02-05 | Discharge: 2021-02-05 | Disposition: A | Discharge status: Home / Self Care | Payer: BC Managed Care – PPO | Source: Ambulatory Visit | Attending: Family Medicine | Admitting: Family Medicine

## 2021-02-05 DIAGNOSIS — G8929 Other chronic pain: Secondary | ICD-10-CM | POA: Diagnosis present

## 2021-02-05 DIAGNOSIS — M5442 Lumbago with sciatica, left side: Secondary | ICD-10-CM | POA: Diagnosis present

## 2021-02-17 IMAGING — CR CHEST - 2 VIEW
1 series · 2 of 2 positions shown · non-contrast
Comparison: 02/06/2016

CLINICAL DATA: Left-sided back and rib pain following a big cough
last night.

EXAM:
CHEST - 2 VIEW

[Series 1: dg chest 2 view · 0.14mm/px · 2 of 2 slices shown]
[im 1/2]
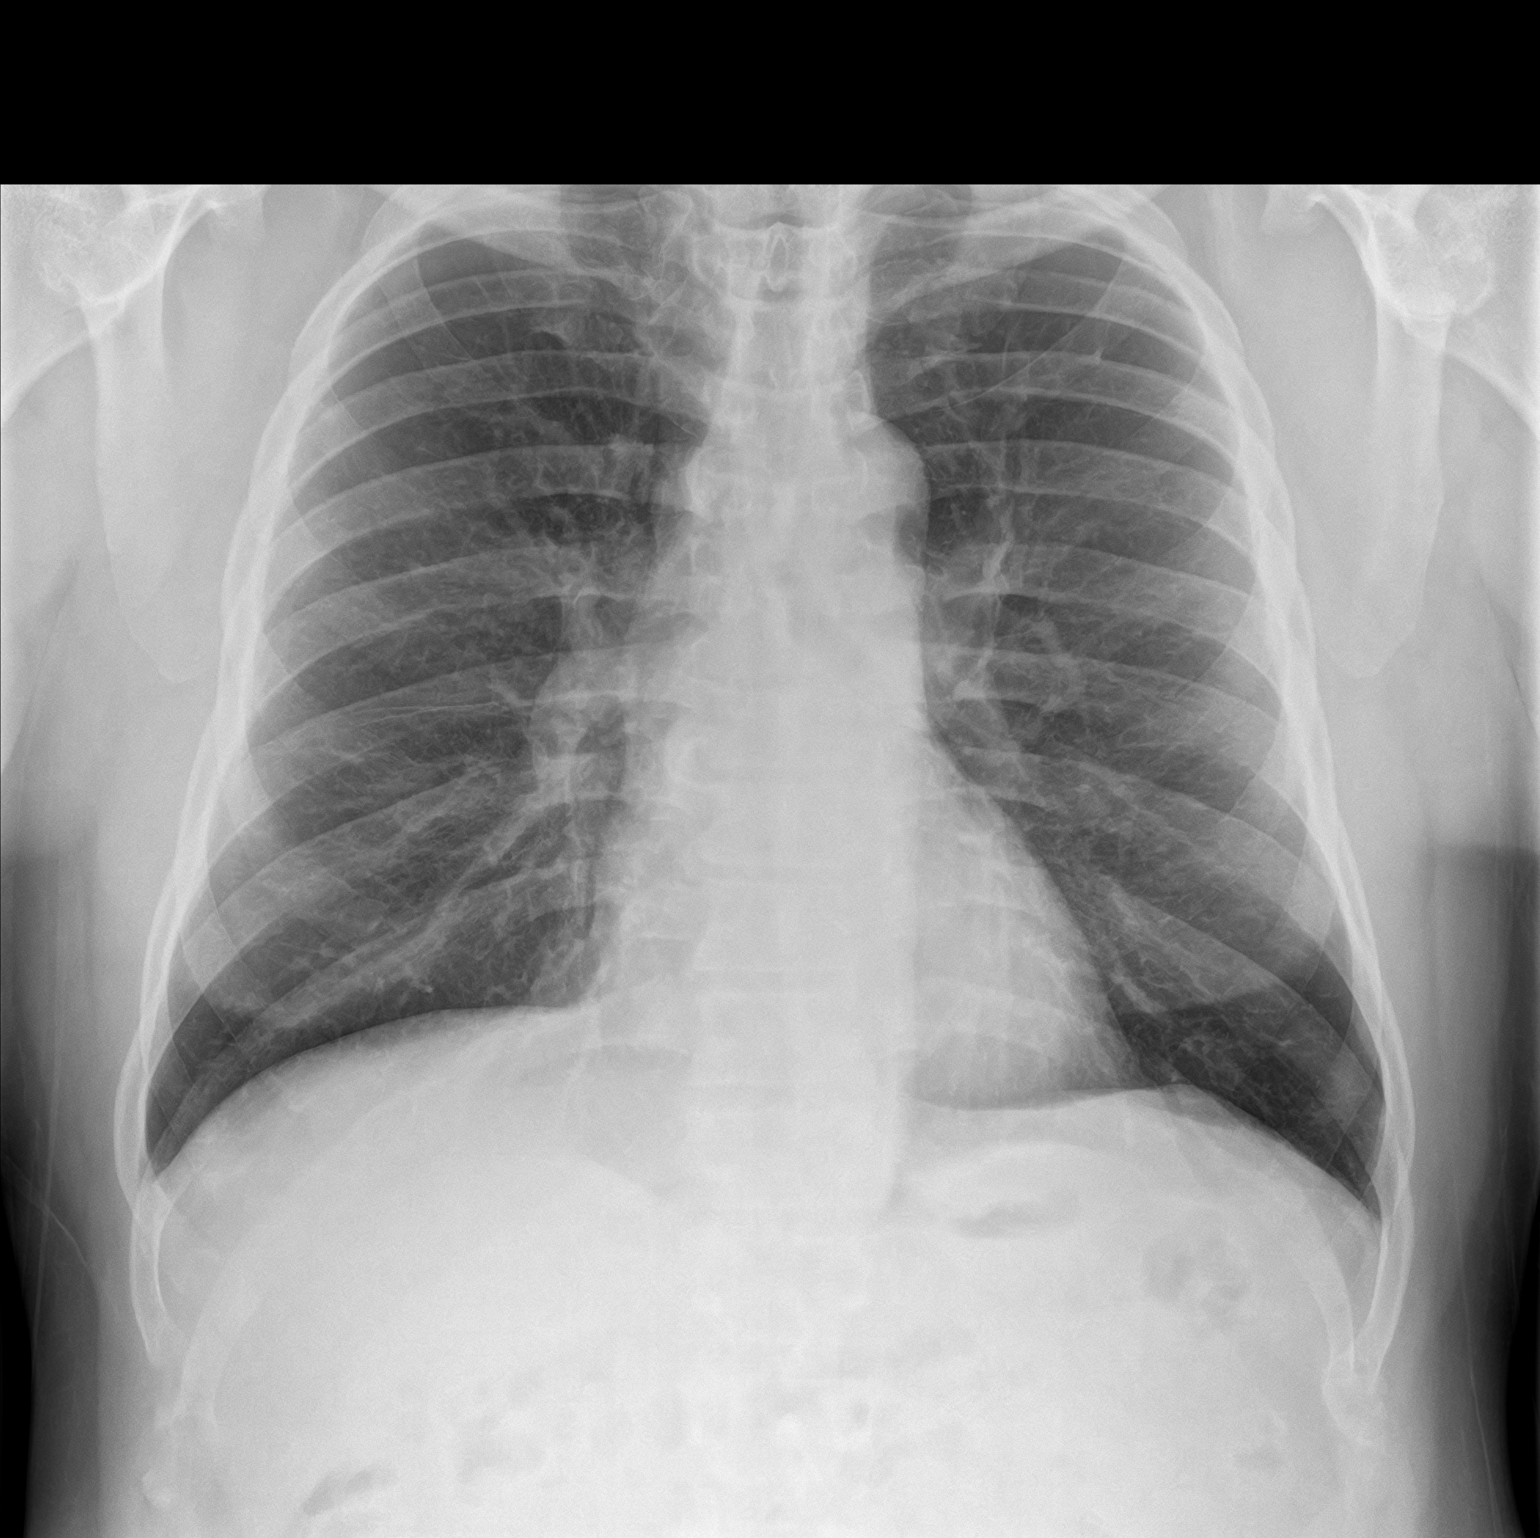
[im 2/2]
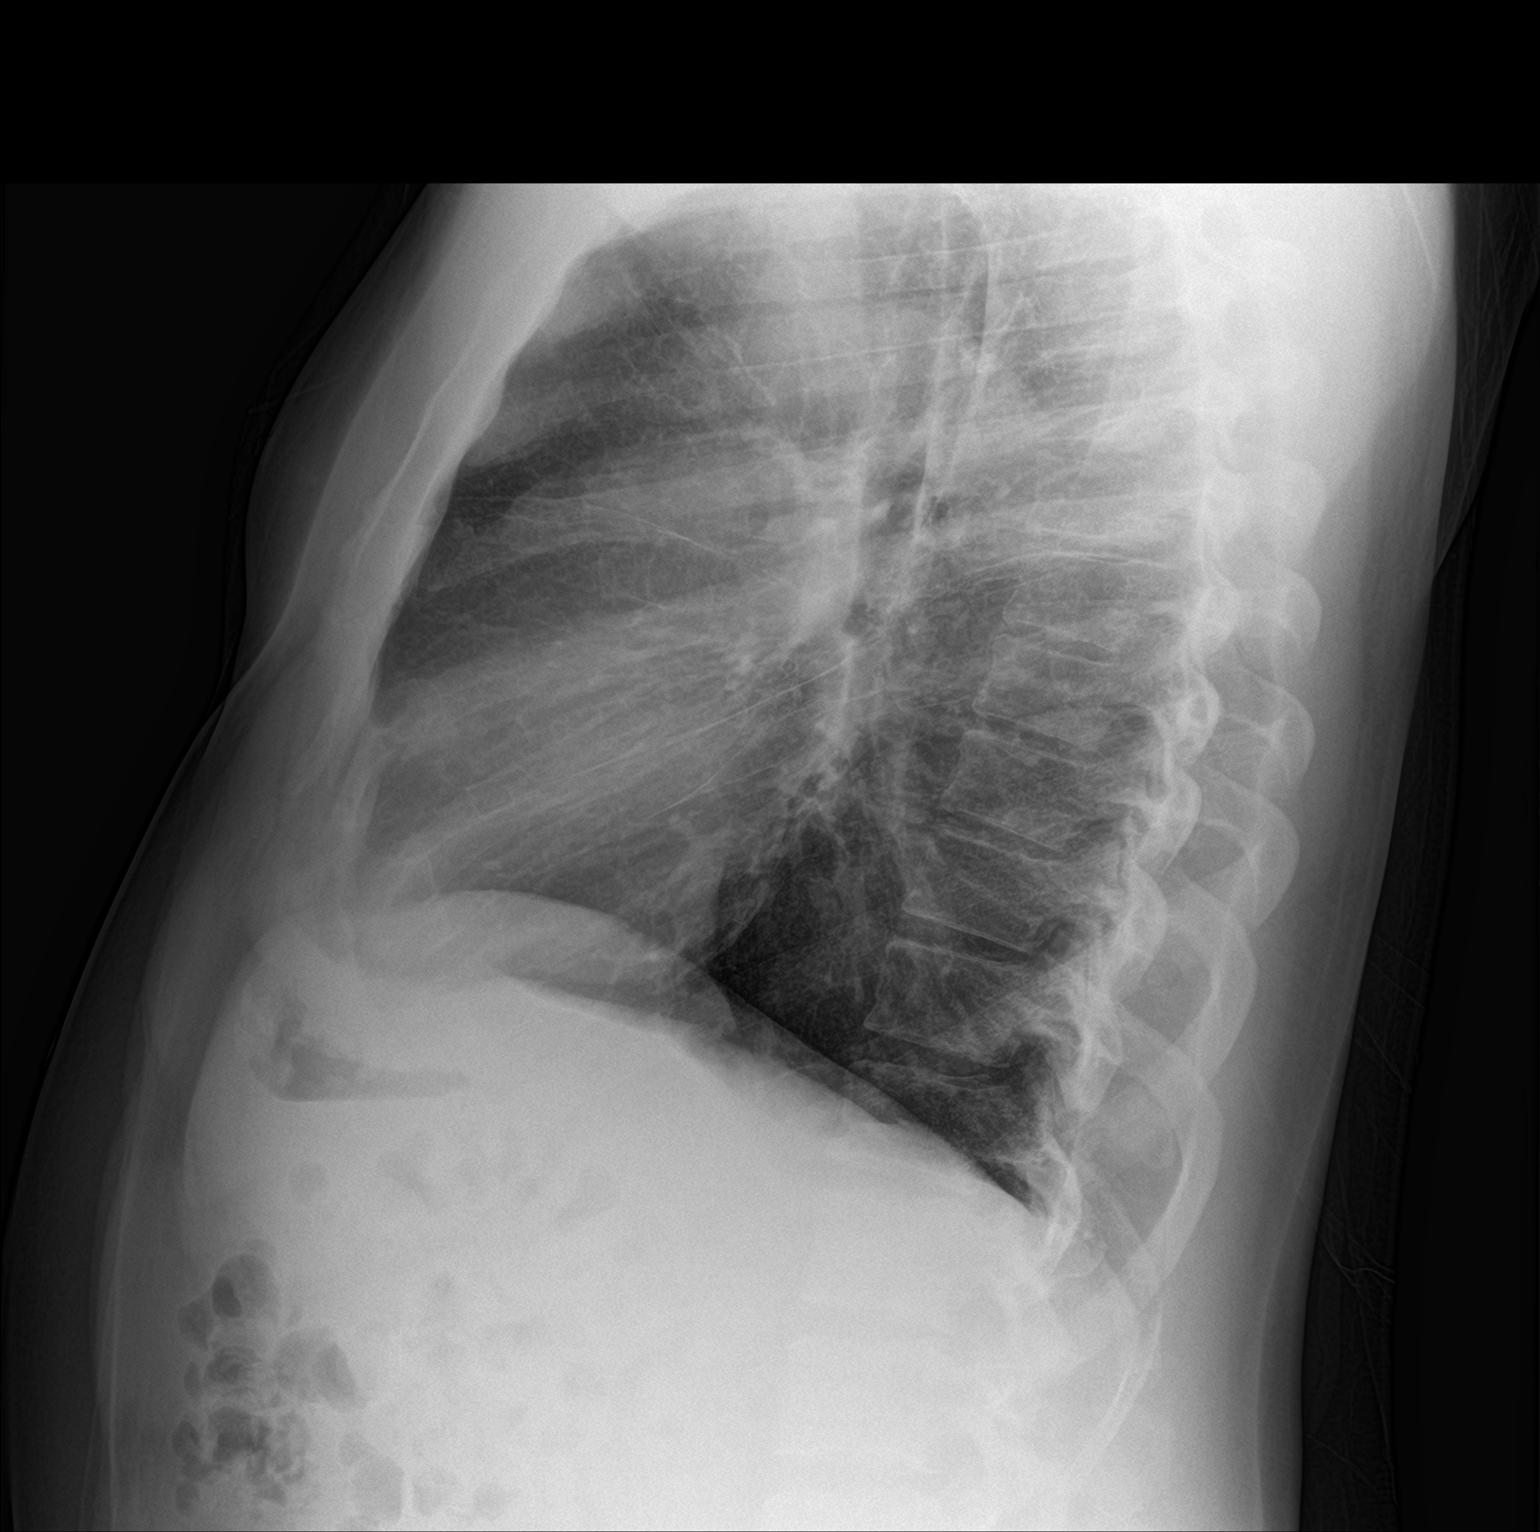

[2 of 2 positions shown; findings below may reference images not displayed]

FINDINGS: Cardiac silhouette is normal in size. No mediastinal or hilar
masses. There is no evidence of adenopathy.

Clear lungs.  No pleural effusion or pneumothorax.

Skeletal structures are intact.  No evidence of a rib fracture.
IMPRESSION: No active cardiopulmonary disease.

## 2021-12-05 ENCOUNTER — Other Ambulatory Visit: Payer: Self-pay | Admitting: Internal Medicine

## 2021-12-05 DIAGNOSIS — R0602 Shortness of breath: Secondary | ICD-10-CM

## 2021-12-05 DIAGNOSIS — R079 Chest pain, unspecified: Secondary | ICD-10-CM

## 2021-12-11 ENCOUNTER — Telehealth (HOSPITAL_COMMUNITY): Payer: Self-pay | Admitting: *Deleted

## 2021-12-11 ENCOUNTER — Other Ambulatory Visit (HOSPITAL_COMMUNITY): Payer: Self-pay | Admitting: *Deleted

## 2021-12-11 MED ORDER — METOPROLOL TARTRATE 100 MG PO TABS: ORAL_TABLET | ORAL | 0 refills | Status: AC

## 2021-12-11 NOTE — Telephone Encounter (Signed)
Reaching out to patient to offer assistance regarding upcoming cardiac imaging study; pt verbalizes understanding of appt date/time, parking situation and where to check in, medications ordered, and verified current allergies; name and call back number provided for further questions should they arise  Gordy Clement RN Navigator Cardiac Imaging [facility] [phone] office [phone] cell  Patient to take '100mg'$  two hours prior to his cardiac CT scan.

## 2021-12-15 ENCOUNTER — Ambulatory Visit
Admission: RE | Admit: 2021-12-15 | Discharge: 2021-12-15 | Disposition: A | Discharge status: Home / Self Care | Payer: BC Managed Care – PPO | Source: Ambulatory Visit | Attending: Internal Medicine | Admitting: Internal Medicine

## 2021-12-15 DIAGNOSIS — R079 Chest pain, unspecified: Secondary | ICD-10-CM | POA: Insufficient documentation

## 2021-12-15 DIAGNOSIS — R0602 Shortness of breath: Secondary | ICD-10-CM | POA: Diagnosis present

## 2021-12-15 MED ORDER — NITROGLYCERIN 0.4 MG SL SUBL
0.8000 mg | SUBLINGUAL_TABLET | Freq: Once | SUBLINGUAL | Status: AC
  Administered 2021-12-15: 0.8 mg via SUBLINGUAL
  Filled 2021-12-15: qty 25

## 2021-12-15 MED ORDER — METOPROLOL TARTRATE 5 MG/5ML IV SOLN
5.0000 mg | Freq: Once | INTRAVENOUS | Status: AC
  Administered 2021-12-15: 5 mg via INTRAVENOUS

## 2021-12-15 MED ORDER — METOPROLOL TARTRATE 5 MG/5ML IV SOLN
INTRAVENOUS | Status: AC
  Filled 2021-12-15: qty 10

## 2021-12-15 MED ORDER — IOHEXOL 350 MG/ML SOLN
100.0000 mL | Freq: Once | INTRAVENOUS | Status: AC | PRN
  Administered 2021-12-15: 100 mL via INTRAVENOUS

## 2021-12-15 NOTE — Progress Notes (Signed)
Patient tolerated procedure well. Ambulate w/o difficulty. Denies any lightheadedness or being dizzy. Pt denies any pain at this time. Sitting in chair, no voiced complaints at this time. Pt is encouraged to drink additional water throughout the day and reason explained to patient. Patient verbalized understanding and all questions answered. ABC intact. No further needs at this time. Discharge from procedure area w/o issues.

## 2022-01-13 ENCOUNTER — Other Ambulatory Visit: Payer: Self-pay

## 2022-01-13 ENCOUNTER — Emergency Department: Payer: BC Managed Care – PPO

## 2022-01-13 ENCOUNTER — Emergency Department
Admission: EM | Admit: 2022-01-13 | Discharge: 2022-01-13 | Disposition: A | Discharge status: Home / Self Care | Payer: BC Managed Care – PPO | Attending: Emergency Medicine | Admitting: Emergency Medicine

## 2022-01-13 DIAGNOSIS — R0789 Other chest pain: Secondary | ICD-10-CM | POA: Diagnosis not present

## 2022-01-13 DIAGNOSIS — R079 Chest pain, unspecified: Secondary | ICD-10-CM

## 2022-01-13 HISTORY — DX: Essential (primary) hypertension: I10

## 2022-01-13 LAB — CBC WITH DIFFERENTIAL/PLATELET
Abs Immature Granulocytes: 0.01 10*3/uL (ref 0.00–0.07)
Basophils Absolute: 0 10*3/uL (ref 0.0–0.1)
Basophils Relative: 1 %
Eosinophils Absolute: 0 10*3/uL (ref 0.0–0.5)
Eosinophils Relative: 1 %
HCT: 47.1 % (ref 39.0–52.0)
Hemoglobin: 15.3 g/dL (ref 13.0–17.0)
Immature Granulocytes: 0 %
Lymphocytes Relative: 53 %
Lymphs Abs: 2.7 10*3/uL (ref 0.7–4.0)
MCH: 29.1 pg (ref 26.0–34.0)
MCHC: 32.5 g/dL (ref 30.0–36.0)
MCV: 89.5 fL (ref 80.0–100.0)
Monocytes Absolute: 0.5 10*3/uL (ref 0.1–1.0)
Monocytes Relative: 10 %
Neutro Abs: 1.8 10*3/uL (ref 1.7–7.7)
Neutrophils Relative %: 35 %
Platelets: 167 10*3/uL (ref 150–400)
RBC: 5.26 MIL/uL (ref 4.22–5.81)
RDW: 13.6 % (ref 11.5–15.5)
WBC: 5 10*3/uL (ref 4.0–10.5)
nRBC: 0 % (ref 0.0–0.2)

## 2022-01-13 LAB — COMPREHENSIVE METABOLIC PANEL
ALT: 24 U/L (ref 0–44)
AST: 29 U/L (ref 15–41)
Albumin: 4.4 g/dL (ref 3.5–5.0)
Alkaline Phosphatase: 59 U/L (ref 38–126)
Anion gap: 6 (ref 5–15)
BUN: 13 mg/dL (ref 6–20)
CO2: 25 mmol/L (ref 22–32)
Calcium: 9.2 mg/dL (ref 8.9–10.3)
Chloride: 106 mmol/L (ref 98–111)
Creatinine, Ser: 1.04 mg/dL (ref 0.61–1.24)
GFR, Estimated: >60 mL/min (ref >60)
Glucose, Bld: 109 mg/dL — ABNORMAL HIGH (ref 70–99)
Potassium: 3.7 mmol/L (ref 3.5–5.1)
Sodium: 137 mmol/L (ref 135–145)
Total Bilirubin: 0.7 mg/dL (ref 0.3–1.2)
Total Protein: 7.3 g/dL (ref 6.5–8.1)

## 2022-01-13 LAB — TROPONIN I (HIGH SENSITIVITY): Troponin I (High Sensitivity): 10 ng/L (ref <18)

## 2022-01-13 LAB — D-DIMER, QUANTITATIVE: D-Dimer, Quant: 0.37 ug/mL-FEU (ref 0.00–0.50)

## 2022-01-13 MED ORDER — NITROGLYCERIN 0.3 MG SL SUBL: 0.3000 mg | SUBLINGUAL_TABLET | SUBLINGUAL | 3 refills | Status: AC | PRN

## 2022-01-13 NOTE — ED Provider Triage Note (Signed)
  Emergency Medicine Provider Triage Evaluation Note  Elijan Googe Sr. , a 51 y.o.male,  was evaluated in triage.  Pt complains of right-sided chest pain since last night.  He states that he trialed a few breathing treatments, which has helped in the past, however they have not helped him so far.  Described as 5/10.  Additional endorses chronic, ongoing bilateral leg pain as well.   Review of Systems  Positive: Chest pain Negative: Denies fever, abdominal pain, vomiting  Physical Exam   Vitals:   01/13/22 1148  BP: (!) 150/99  Pulse: 75  Resp: 18  Temp: 98 F (36.7 C)  SpO2: 96%   Gen:   Awake, no distress   Resp:  Normal effort  MSK:   Moves extremities without difficulty  Other:    Medical Decision Making  Given the patient's initial medical screening exam, the following diagnostic evaluation has been ordered. The patient will be placed in the appropriate treatment space, once one is available, to complete the evaluation and treatment. I have discussed the plan of care with the patient and I have advised the patient that an ED physician or mid-level practitioner will reevaluate their condition after the test results have been received, as the results may give them additional insight into the type of treatment they may need.    Diagnostics: Labs, EKG, CXR  Treatments: none immediately   Teodoro Spray, [place] 01/13/22 1150

## 2022-01-13 NOTE — ED Triage Notes (Signed)
Pt to ED via POV from home. Pt reports intermittent right sided CP that started yesterday. Pt took a breathing tx PTA.

## 2022-01-13 NOTE — ED Provider Notes (Signed)
[facility] Provider Note    Event Date/Time   First MD Initiated Contact with Patient 01/13/22 1447     (approximate)   History   Chest Pain   HPI  Rickey Preast Sr. is a 51 y.o. male with history of diabetes, high blood pressure who presents with complaints of chest pain.  He is asymptomatic at this time however he reports this morning he felt mild pressure-like pain.  He reports this occurred yesterday to while he was in bed.  Had not eaten anything reportedly.  Does have a history of GERD.  Sees Dr. Call would of cardiology, had stress test 1 month ago reportedly.  Does not know the results     Physical Exam   Triage Vital Signs: ED Triage Vitals [01/13/22 1148]  Enc Vitals Group     BP (!) 150/99     Pulse Rate 75     Resp 18     Temp 98 F (36.7 C)     Temp Source Oral     SpO2 96 %     Weight 102.1 kg (225 lb)     Height 1.829 m (6')     Head Circumference      Peak Flow      Pain Score 5     Pain Loc      Pain Edu?      Excl. in [place]?     Most recent vital signs: Vitals:   01/13/22 1148  BP: (!) 150/99  Pulse: 75  Resp: 18  Temp: 98 F (36.7 C)  SpO2: 96%     General: Awake, no distress.  CV:  Good peripheral perfusion.  Regular rate and rhythm Resp:  Normal effort.  Clear to auscultation bilaterally Abd:  No distention.  Other:  No calf pain or swelling   ED Results / Procedures / Treatments   Labs (all labs ordered are listed, but only abnormal results are displayed) Labs Reviewed  COMPREHENSIVE METABOLIC PANEL - Abnormal; Notable for the following components:      Result Value   Glucose, Bld 109 (*)    All other components within normal limits  CBC WITH DIFFERENTIAL/PLATELET  D-DIMER, QUANTITATIVE  TROPONIN I (HIGH SENSITIVITY)  TROPONIN I (HIGH SENSITIVITY)     EKG  ED ECG REPORT I, Lavonia Drafts, the attending physician, personally viewed and interpreted this ECG.  Date: 01/13/2022  Rhythm: normal  sinus rhythm QRS Axis: normal Intervals: normal ST/T Wave abnormalities: Nonspecific changes Narrative Interpretation: no evidence of acute ischemia    RADIOLOGY Chest x-ray viewed interpreted by me, no pneumonia    PROCEDURES:  Critical Care performed:   Procedures   MEDICATIONS ORDERED IN ED: Medications - No data to display   IMPRESSION / MDM / [place] / ED COURSE  I reviewed the triage vital signs and the nursing notes. Patient's presentation is most consistent with acute presentation with potential threat to life or bodily function.  Patient presents with chest pain as detailed above.  Has risk factors for CAD.  Does see cardiology  Differential includes ACS, angina, chest wall pain, GERD, pneumonia  Chest x-ray negative for pneumonia  EKG and high sensitive troponin are reassuring, patient is asymptomatic, lab work reviewed and is normal.  Discussed with patient the need for close follow-up with cardiology, offered admission however he declined.  Strict return precautions        FINAL CLINICAL IMPRESSION(S) / ED DIAGNOSES   Final diagnoses:  Nonspecific chest pain     Rx / DC Orders   ED Discharge Orders          Ordered    Ambulatory referral to Cardiology       Comments: If you have not heard from the Cardiology office within the next 72 hours please call [phone].   01/13/22 1454    nitroGLYCERIN (NITROSTAT) 0.3 MG SL tablet  Every 5 min PRN        01/13/22 1454             Note:  This document was prepared using Dragon voice recognition software and may include unintentional dictation errors.   Lavonia Drafts, MD 01/13/22 1705

## 2023-04-02 ENCOUNTER — Ambulatory Visit (INDEPENDENT_AMBULATORY_CARE_PROVIDER_SITE_OTHER): Payer: BC Managed Care – PPO

## 2023-04-02 ENCOUNTER — Ambulatory Visit: Payer: No Typology Code available for payment source | Admitting: Podiatry

## 2023-04-02 DIAGNOSIS — M778 Other enthesopathies, not elsewhere classified: Secondary | ICD-10-CM

## 2023-04-02 DIAGNOSIS — L84 Corns and callosities: Secondary | ICD-10-CM | POA: Diagnosis not present

## 2023-04-02 DIAGNOSIS — L02612 Cutaneous abscess of left foot: Secondary | ICD-10-CM | POA: Diagnosis not present

## 2023-04-02 DIAGNOSIS — R609 Edema, unspecified: Secondary | ICD-10-CM

## 2023-04-02 MED ORDER — CIPROFLOXACIN HCL 500 MG PO TABS: 500.0000 mg | ORAL_TABLET | Freq: Two times a day (BID) | ORAL | 0 refills | Status: DC

## 2023-04-02 NOTE — Progress Notes (Signed)
 Subjective:  Patient ID: Rickey Prince Sr., male    DOB: [birth year],  MRN: [id number]  Chief Complaint  Patient presents with   Foot Pain    RM# Patient presents today with left foot pain has had previous foot surgery done by Dr. Charlsie Merles between fourth toe. Has be having pain and burning. Has tried changing inserts with no relief.    Discussed the use of AI scribe software for clinical note transcription with the patient, who gave verbal consent to proceed.  History of Present Illness          Rickey Mallek Sr. is a 53 year old male with diabetes who presents with left foot pain and recurrent infections.  He has been experiencing ongoing issues with his left foot, including pain and recurrent infections. Initially, he visited a doctor in [place] who prescribed medication for fluid and antibiotics. A scab was removed from the affected area, and the site was shaved, which initially reduced the pain. Despite taking the medication for two to three weeks, the pain persists, particularly at a specific spot on the foot.  He completed a course of ciprofloxacin, taken twice daily, approximately one to two weeks ago. The antibiotic initially helped, as he noticed improvement in the color of his toes. However, symptoms worsened after discontinuing the medication and trying new shoe inserts, which seemed to exacerbate the pain, particularly in the toes.  He has a history of surgery on the left foot due to an infection in the little toe, which required emergency surgery followed by additional procedures. He reports frequent infections in the foot and numbness in both feet, which has been ongoing for a while. No recent injuries to the foot.  No recent fevers or chills but there is a burning sensation in the foot, particularly in the morning. The spot on his foot occasionally drains and forms a scab, which he removes, only for it to reform later. He is concerned about the persistent pain and irritation,  especially as it impacts his ability to work.   Objective:    Physical Exam         General: AAO x3, NAD  Dermatological: Hyperkeratotic lesion noted left foot submetatarsal 4 without any underlying ulceration or drainage or signs of infection.  Left third interspace dorsally peers to be.  The callus formation but upon debridement there is no underlying ulceration but is localized edema.  No fluctuation or crepitation.  Localized darkened discoloration, from inflammation.  Vascular: Dorsalis Pedis artery and Posterior Tibial artery pedal pulses are 2/4 bilateral with immedate capillary fill time.  There is no pain with calf compression, swelling, warmth, erythema.   Neruologic: Grossly intact via light touch bilateral.   Musculoskeletal: Tenderness on the third interspace.  There is no crepitation.  There is no other areas of discomfort.  Gait: Unassisted, Nonantalgic.    No images are attached to the encounter.    Results          Assessment:   1. Capsulitis of left foot   2. Abscess of left foot   3. Callus   4. Swelling      Plan:  Patient was evaluated and treated and all questions answered.  Assessment and Plan           Diabetic Foot Ulcer Painful lesion between the third and fourth toes of the left foot. Recent history of antibiotic treatment with Ciprofloxacin, which the patient reported as helpful. No current signs of systemic infection. Concern for possible  developing abscess. -Resume Ciprofloxacin. -Order MRI of left foot without contrast to assess for deeper pathology. -Provide patient with a surgical shoe to offload pressure from the affected area. -Advise patient to monitor for signs of infection and to contact the office immediately if symptoms worsen.  Diabetes Mellitus Last A1c was 7.4 on December 25, 2022. Patient reports numbness in both feet. -No changes to current management plan at this time.  Follow-up Patient to contact [facility]  Imaging to schedule stat MRI. Follow-up appointment to be scheduled after MRI results are available.    No follow-ups on file.   Vivi Barrack DPM

## 2023-04-05 ENCOUNTER — Other Ambulatory Visit: Payer: BC Managed Care – PPO

## 2023-04-25 ENCOUNTER — Ambulatory Visit
Admission: RE | Admit: 2023-04-25 | Discharge: 2023-04-25 | Disposition: A | Discharge status: Home / Self Care | Source: Ambulatory Visit | Attending: Podiatry | Admitting: Podiatry

## 2023-04-25 DIAGNOSIS — L02612 Cutaneous abscess of left foot: Secondary | ICD-10-CM

## 2023-05-14 ENCOUNTER — Ambulatory Visit: Admitting: Podiatry

## 2023-05-14 DIAGNOSIS — E1149 Type 2 diabetes mellitus with other diabetic neurological complication: Secondary | ICD-10-CM | POA: Diagnosis not present

## 2023-05-14 DIAGNOSIS — L84 Corns and callosities: Secondary | ICD-10-CM | POA: Diagnosis not present

## 2023-05-14 MED ORDER — CICLOPIROX 0.77 % EX GEL
1.0000 | Freq: Two times a day (BID) | CUTANEOUS | 0 refills | Status: DC
Start: 2023-05-14 — End: 2023-08-20

## 2023-05-14 MED ORDER — GABAPENTIN 300 MG PO CAPS: 300.0000 mg | ORAL_CAPSULE | Freq: Three times a day (TID) | ORAL | 3 refills | Status: AC

## 2023-05-18 NOTE — Progress Notes (Signed)
  Subjective:  Patient ID: Rickey Barman Sr., male    DOB: Sep 25, 1970,  MRN: [id number]  Chief Complaint  Patient presents with   Foot Pain    RM#12 Left foot pain follow up medication was increased last visit but pharmacy isn't aware. Gabapentin not listed on medication list but states is taking three times daily.        Rickey Handler Sr. is a 53 year old male with diabetes who presents with left foot pain and recurrent infections.  States that he is a prescription for gabapentin.  States he has been doing well the calluses come back on the left foot.  Denies any open sores or any swelling redness or drainage.  No fevers or chills.  Objective:    Physical Exam         General: AAO x3, NAD  Dermatological: Hyperkeratotic lesion noted left foot submetatarsal 4 without any underlying ulceration or drainage or signs of infection.  There is no open lesion identified of this area along the interspaces.  There is no erythema or warmth or any drainage or pus or any fluctuation or crepitation.  There is no malodor.  Vascular: Dorsalis Pedis artery and Posterior Tibial artery pedal pulses are 2/4 bilateral with immedate capillary fill time.  There is no pain with calf compression, swelling, warmth, erythema.   Neruologic: Grossly intact via light touch bilateral.   Musculoskeletal: Tenderness on the hyperkeratotic lesion.  There is no crepitation.  There is no other areas of discomfort.  Gait: Unassisted, Nonantalgic.    No images are attached to the encounter.    Results          Assessment:   Preulcerative lesion left foot, neuropathy  Plan:  Patient was evaluated and treated and all questions answered.  Assessment and Plan       Preulcerative lesion -Sharply debrided x 1 without any complications or bleeding.  Discussed moisturizer, offloading.  Monitor for any signs or symptoms of infection or reoccurrence. -MRI reviewed with the patient   Neuropathy -New prescription for  gabapentin prescribed.  Discussed side effects.  Should he develop any side effects to let me know.  -Daily foot inspection  Return in about 3 months (around 08/13/2023).  Charity Conch DPM

## 2023-05-24 IMAGING — CR DG CHEST 2V
2 series · 2 of 2 positions shown · non-contrast
Comparison: 09/02/2018

CLINICAL DATA: Left-sided pleuritic chest pain for 2 days.

EXAM:
CHEST - 2 VIEW

[chest pa]
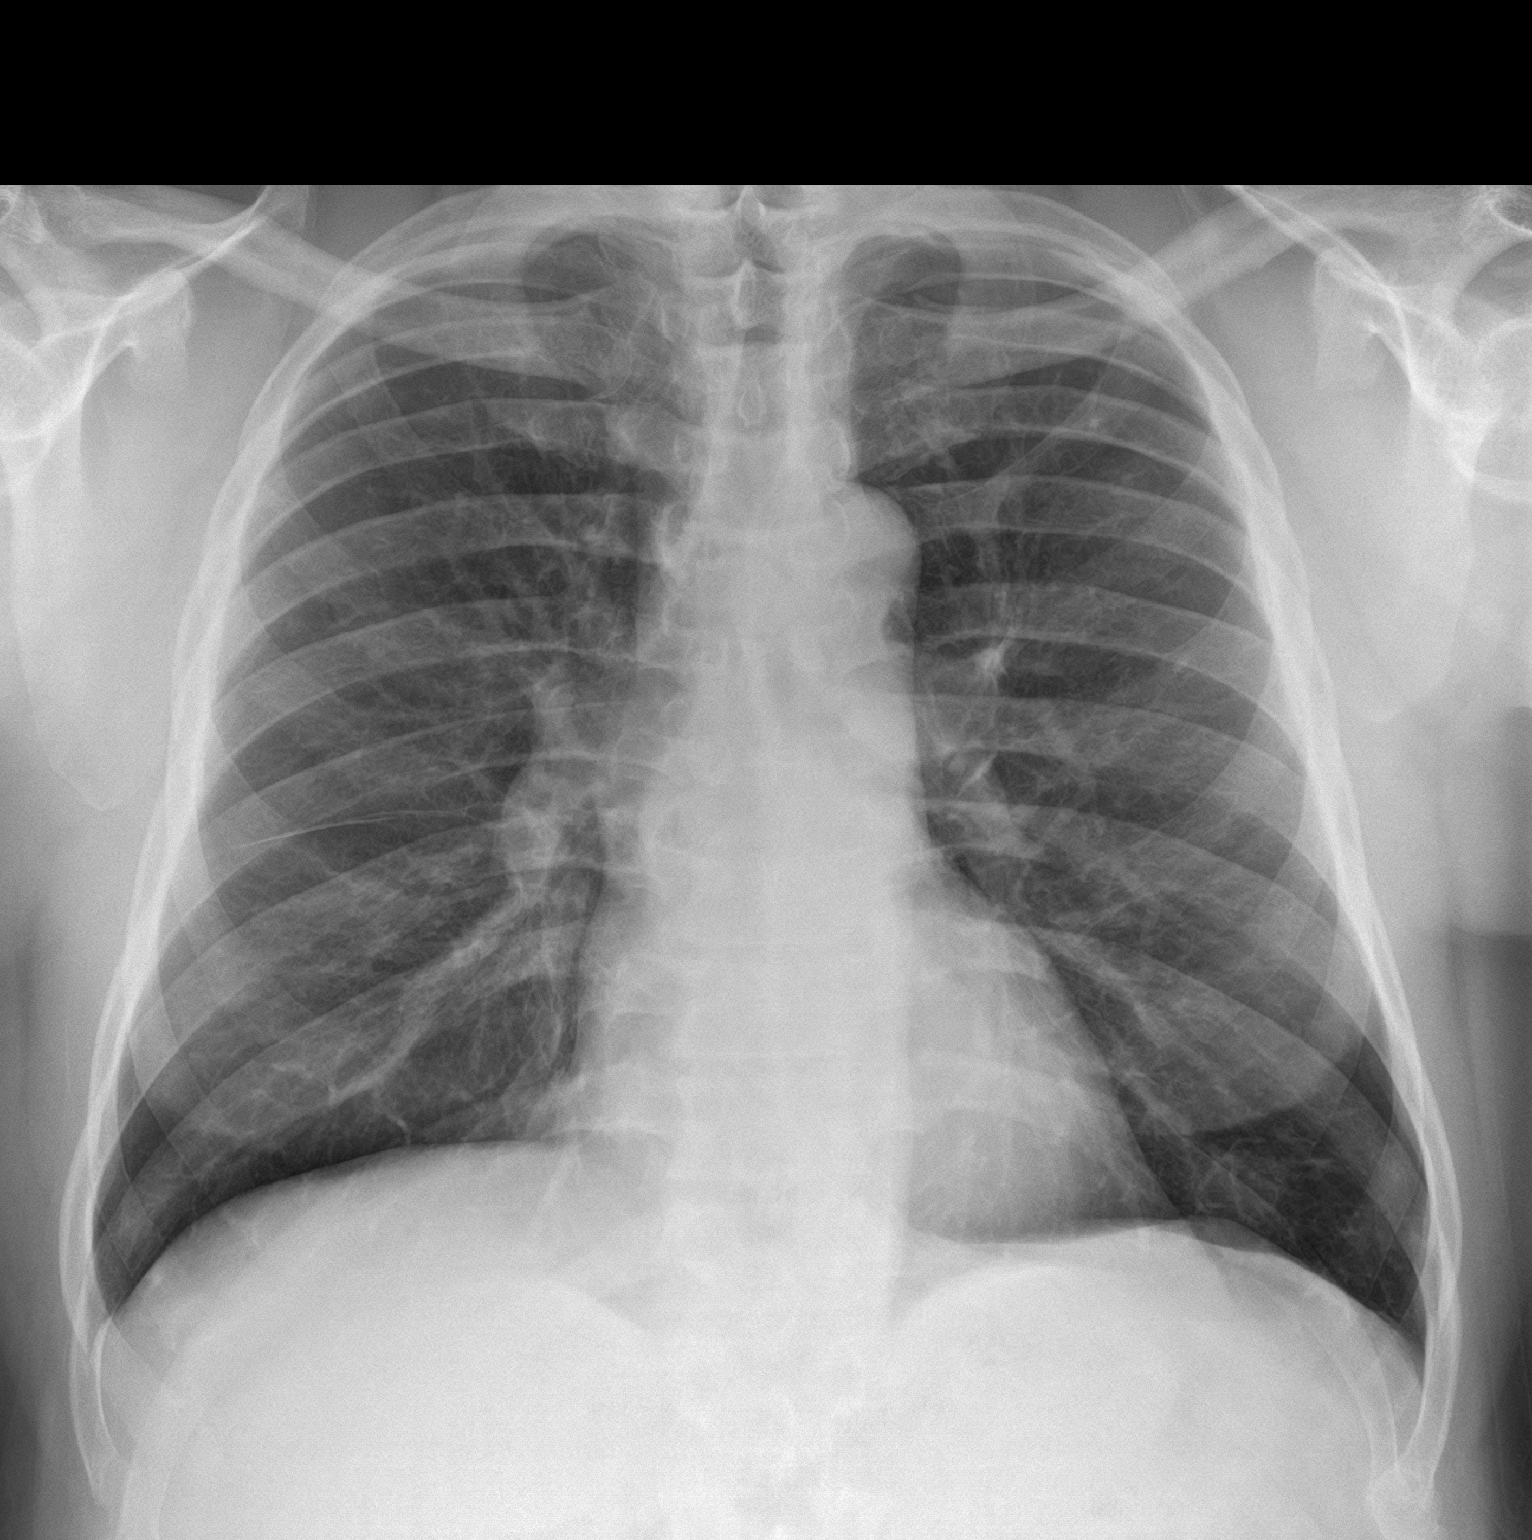

[chest lat]
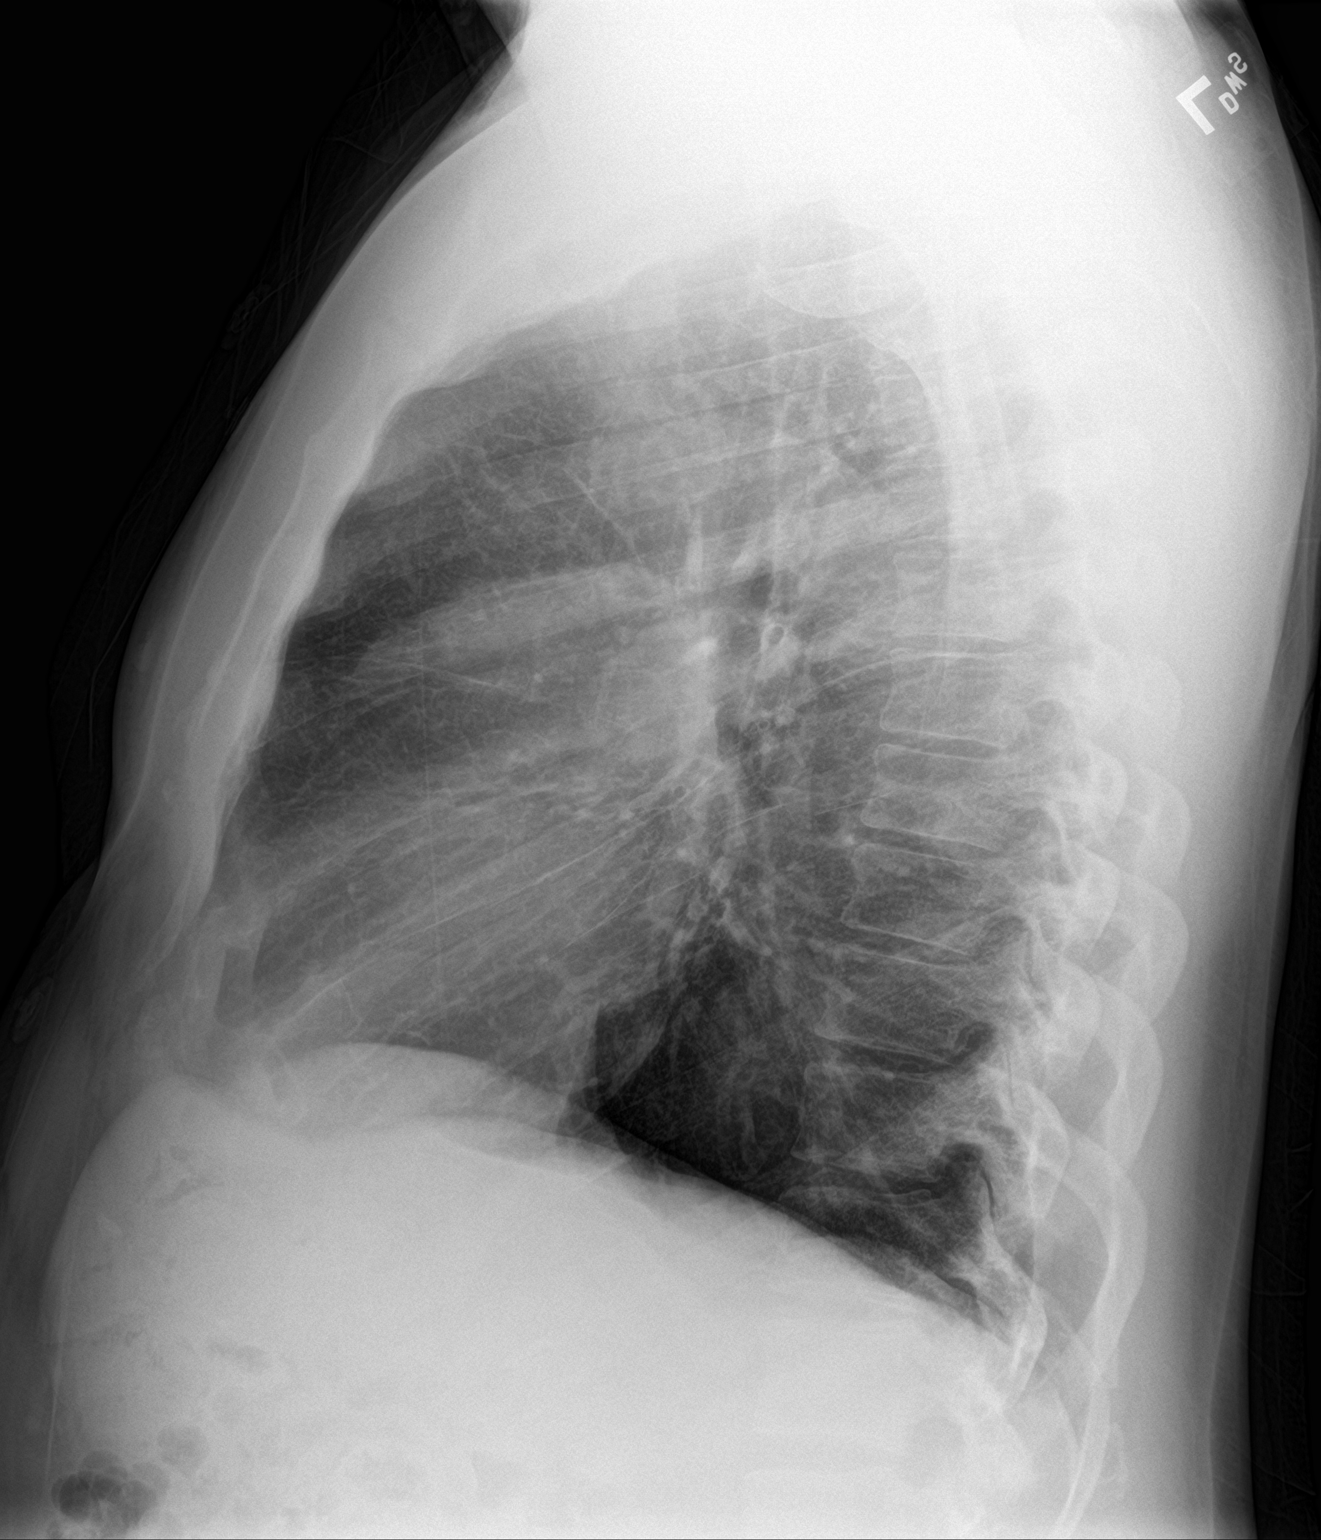

[2 of 2 positions shown; findings below may reference images not displayed]

FINDINGS: The heart size and mediastinal contours are within normal limits.
Both lungs are clear. No evidence of pneumothorax or pleural
effusion. The visualized skeletal structures are unremarkable.
IMPRESSION: No active cardiopulmonary disease.

## 2023-07-03 ENCOUNTER — Emergency Department
Admission: EM | Admit: 2023-07-03 | Discharge: 2023-07-03 | Disposition: A | Discharge status: Home / Self Care | Attending: Emergency Medicine | Admitting: Emergency Medicine

## 2023-07-03 ENCOUNTER — Other Ambulatory Visit: Payer: Self-pay

## 2023-07-03 DIAGNOSIS — R319 Hematuria, unspecified: Secondary | ICD-10-CM | POA: Diagnosis present

## 2023-07-03 DIAGNOSIS — I10 Essential (primary) hypertension: Secondary | ICD-10-CM | POA: Insufficient documentation

## 2023-07-03 DIAGNOSIS — E119 Type 2 diabetes mellitus without complications: Secondary | ICD-10-CM | POA: Diagnosis not present

## 2023-07-03 LAB — URINALYSIS, ROUTINE W REFLEX MICROSCOPIC
RBC / HPF: >50 RBC/hpf (ref 0–5)
WBC, UA: >50 WBC/hpf (ref 0–5)

## 2023-07-03 LAB — COMPREHENSIVE METABOLIC PANEL WITH GFR
ALT: 24 U/L (ref 0–44)
AST: 24 U/L (ref 15–41)
Albumin: 4.2 g/dL (ref 3.5–5.0)
Alkaline Phosphatase: 56 U/L (ref 38–126)
Anion gap: 5 (ref 5–15)
BUN: 14 mg/dL (ref 6–20)
CO2: 29 mmol/L (ref 22–32)
Calcium: 9 mg/dL (ref 8.9–10.3)
Chloride: 99 mmol/L (ref 98–111)
Creatinine, Ser: 1.14 mg/dL (ref 0.61–1.24)
GFR, Estimated: >60 mL/min (ref >60)
Glucose, Bld: 107 mg/dL — ABNORMAL HIGH (ref 70–99)
Potassium: 4.2 mmol/L (ref 3.5–5.1)
Sodium: 133 mmol/L — ABNORMAL LOW (ref 135–145)
Total Bilirubin: 0.8 mg/dL (ref 0.0–1.2)
Total Protein: 7.2 g/dL (ref 6.5–8.1)

## 2023-07-03 LAB — CBC WITH DIFFERENTIAL/PLATELET
Abs Immature Granulocytes: 0.04 10*3/uL (ref 0.00–0.07)
Basophils Absolute: 0 10*3/uL (ref 0.0–0.1)
Basophils Relative: 0 %
Eosinophils Absolute: 0 10*3/uL (ref 0.0–0.5)
Eosinophils Relative: 0 %
HCT: 44.5 % (ref 39.0–52.0)
Hemoglobin: 15 g/dL (ref 13.0–17.0)
Immature Granulocytes: 0 %
Lymphocytes Relative: 19 %
Lymphs Abs: 1.7 10*3/uL (ref 0.7–4.0)
MCH: 29.6 pg (ref 26.0–34.0)
MCHC: 33.7 g/dL (ref 30.0–36.0)
MCV: 87.9 fL (ref 80.0–100.0)
Monocytes Absolute: 0.8 10*3/uL (ref 0.1–1.0)
Monocytes Relative: 9 %
Neutro Abs: 6.3 10*3/uL (ref 1.7–7.7)
Neutrophils Relative %: 72 %
Platelets: 155 10*3/uL (ref 150–400)
RBC: 5.06 MIL/uL (ref 4.22–5.81)
RDW: 13.5 % (ref 11.5–15.5)
WBC: 8.9 10*3/uL (ref 4.0–10.5)
nRBC: 0 % (ref 0.0–0.2)

## 2023-07-03 MED ORDER — CEFUROXIME AXETIL 250 MG PO TABS: 250.0000 mg | ORAL_TABLET | Freq: Two times a day (BID) | ORAL | 0 refills | Status: DC

## 2023-07-03 NOTE — ED Provider Notes (Signed)
 [facility] Provider Note   Event Date/Time   First MD Initiated Contact with Patient 07/03/23 1751     (approximate) History  Hematuria  HPI Rickey Cen Sr. is a 53 y.o. male with a past medical history of bilateral low back pain and left-sided sciatica, controlled type 2 diabetes, hypertension, and hyperlipidemia who presents complaining of hematuria that began today.  Patient called his primary care physician and was told to present to the emergency department for further evaluation.  Patient does endorse mild dysuria.  Patient does endorse passing clots. ROS: Patient currently denies any vision changes, tinnitus, difficulty speaking, facial droop, sore throat, chest pain, shortness of breath, abdominal pain, nausea/vomiting/diarrhea, or weakness/numbness/paresthesias in any extremity   Physical Exam  Triage Vital Signs: ED Triage Vitals  Encounter Vitals Group     BP 07/03/23 1616 (!) 154/98     Systolic BP Percentile --      Diastolic BP Percentile --      Pulse Rate 07/03/23 1616 86     Resp 07/03/23 1616 17     Temp 07/03/23 1616 98.3 F (36.8 C)     Temp Source 07/03/23 1616 Oral     SpO2 07/03/23 1616 97 %     Weight 07/03/23 1613 230 lb (104.3 kg)     Height 07/03/23 1613 6' (1.829 m)     Head Circumference --      Peak Flow --      Pain Score 07/03/23 1612 4     Pain Loc --      Pain Education --      Exclude from Growth Chart --    Most recent vital signs: Vitals:   07/03/23 1616 07/03/23 1800  BP: (!) 154/98 (!) 142/95  Pulse: 86 82  Resp: 17   Temp: 98.3 F (36.8 C)   SpO2: 97% 99%   General: Awake, oriented x4. CV:  Good peripheral perfusion. Resp:  Normal effort. Abd:  No distention. Other:  Middle-aged obese African-American male resting comfortably in no acute distress ED Results / Procedures / Treatments  Labs (all labs ordered are listed, but only abnormal results are displayed) Labs Reviewed  COMPREHENSIVE METABOLIC  PANEL WITH GFR - Abnormal; Notable for the following components:      Result Value   Sodium 133 (*)    Glucose, Bld 107 (*)    All other components within normal limits  URINALYSIS, ROUTINE W REFLEX MICROSCOPIC - Abnormal; Notable for the following components:   Color, Urine RED (*)    APPearance HAZY (*)    Glucose, UA   (*)    Value: TEST NOT REPORTED DUE TO COLOR INTERFERENCE OF URINE PIGMENT   Hgb urine dipstick   (*)    Value: TEST NOT REPORTED DUE TO COLOR INTERFERENCE OF URINE PIGMENT   Bilirubin Urine   (*)    Value: TEST NOT REPORTED DUE TO COLOR INTERFERENCE OF URINE PIGMENT   Ketones, ur   (*)    Value: TEST NOT REPORTED DUE TO COLOR INTERFERENCE OF URINE PIGMENT   Protein, ur   (*)    Value: TEST NOT REPORTED DUE TO COLOR INTERFERENCE OF URINE PIGMENT   Nitrite   (*)    Value: TEST NOT REPORTED DUE TO COLOR INTERFERENCE OF URINE PIGMENT   Leukocytes,Ua   (*)    Value: TEST NOT REPORTED DUE TO COLOR INTERFERENCE OF URINE PIGMENT   Bacteria, UA RARE (*)    All other  components within normal limits  CBC WITH DIFFERENTIAL/PLATELET  PROCEDURES: Critical Care performed: No Procedures MEDICATIONS ORDERED IN ED: Medications - No data to display IMPRESSION / MDM / ASSESSMENT AND PLAN / ED COURSE  I reviewed the triage vital signs and the nursing notes.                             The patient is on the cardiac monitor to evaluate for evidence of arrhythmia and/or significant heart rate changes. Patient's presentation is most consistent with acute presentation with potential threat to life or bodily function. Pt is a 53 year old male who presents for gross hematuria Workup: UA Findings: UA with mild bacteria concerning for possible infection and will treat empirically. VSS and patient in NAD, afebrile.  Patient's history, exam, and studies ordered are not consistent with kidney stone, urethral or intra-abdominal trauma, drug reaction, coagulopathy, prostatitis or other  serious bacterial infection.  Disposition: Discharge home. Discussed urology follow up for possible cancer risk-factor discussion/workup.   FINAL CLINICAL IMPRESSION(S) / ED DIAGNOSES   Final diagnoses:  Hematuria, unspecified type   Rx / DC Orders   ED Discharge Orders          Ordered    cefUROXime (CEFTIN) 250 MG tablet  2 times daily with meals        07/03/23 1822           Note:  This document was prepared using Dragon voice recognition software and may include unintentional dictation errors.   Meri Pelot K, MD 07/04/23 [phone]

## 2023-07-03 NOTE — ED Triage Notes (Signed)
 Pt comes in via pov with complaints of blood in his urine that started today. Pt states that he passed two blood clots in his urine. Pt contacted his primary care provider, and he was told to be seen in the ER. Pt is complaining of bladder pain 4/10. Pt is alert and oriented x4 with no signs of acute distress at this time.

## 2023-07-08 ENCOUNTER — Ambulatory Visit
Admission: RE | Admit: 2023-07-08 | Discharge: 2023-07-08 | Disposition: A | Discharge status: Home / Self Care | Source: Ambulatory Visit | Attending: Physician Assistant | Admitting: Physician Assistant

## 2023-07-08 ENCOUNTER — Other Ambulatory Visit: Payer: Self-pay | Admitting: Physician Assistant

## 2023-07-08 DIAGNOSIS — R319 Hematuria, unspecified: Secondary | ICD-10-CM

## 2023-07-08 DIAGNOSIS — R1032 Left lower quadrant pain: Secondary | ICD-10-CM

## 2023-07-13 ENCOUNTER — Ambulatory Visit
Admission: RE | Admit: 2023-07-13 | Discharge: 2023-07-13 | Disposition: A | Discharge status: Home / Self Care | Source: Ambulatory Visit | Attending: Family Medicine | Admitting: Family Medicine

## 2023-07-13 ENCOUNTER — Other Ambulatory Visit: Payer: Self-pay | Admitting: Family Medicine

## 2023-07-13 DIAGNOSIS — N50812 Left testicular pain: Secondary | ICD-10-CM | POA: Diagnosis present

## 2023-08-20 ENCOUNTER — Ambulatory Visit: Admitting: Podiatry

## 2023-08-20 VITALS — Ht 72.0 in | Wt 230.0 lb

## 2023-08-20 DIAGNOSIS — E1149 Type 2 diabetes mellitus with other diabetic neurological complication: Secondary | ICD-10-CM | POA: Diagnosis not present

## 2023-08-20 DIAGNOSIS — L84 Corns and callosities: Secondary | ICD-10-CM

## 2023-08-20 DIAGNOSIS — B351 Tinea unguium: Secondary | ICD-10-CM

## 2023-08-20 MED ORDER — CICLOPIROX 0.77 % EX GEL: 1.0000 | Freq: Two times a day (BID) | CUTANEOUS | 2 refills | Status: AC

## 2023-08-20 NOTE — Patient Instructions (Signed)
 Gabapentin Capsules or Tablets What is this medication? GABAPENTIN (GA ba pen tin) treats nerve pain. It may also be used to prevent and control seizures in people with epilepsy. It works by calming overactive nerves in your body. This medicine may be used for other purposes; ask your health care provider or pharmacist if you have questions. COMMON BRAND NAME(S): Active-PAC with Gabapentin, Ascencion Dike, Gralise, Neurontin What should I tell my care team before I take this medication? They need to know if you have any of these conditions: Kidney disease Lung or breathing disease Substance use disorder Suicidal thoughts, plans, or attempt by you or a family member An unusual or allergic reaction to gabapentin, other medications, foods, dyes, or preservatives Pregnant or trying to get pregnant Breastfeeding How should I use this medication? Take this medication by mouth with a glass of water. Follow the directions on the prescription label. You can take it with or without food. If it upsets your stomach, take it with food. Take your medication at regular intervals. Do not take it more often than directed. Do not stop taking except on your care team's advice. If you are directed to break the 600 or 800 mg tablets in half as part of your dose, the extra half tablet should be used for the next dose. If you have not used the extra half tablet within 28 days, it should be thrown away. A special MedGuide will be given to you by the pharmacist with each prescription and refill. Be sure to read this information carefully each time. Talk to your care team about the use of this medication in children. While this medication may be prescribed for children as young as 3 years for selected conditions, precautions do apply. Overdosage: If you think you have taken too much of this medicine contact a poison control center or emergency room at once. NOTE: This medicine is only for you. Do not share this medicine with  others. What if I miss a dose? If you miss a dose, take it as soon as you can. If it is almost time for your next dose, take only that dose. Do not take double or extra doses. What may interact with this medication? Alcohol Antihistamines for allergy, cough, and cold Certain medications for anxiety or sleep Certain medications for depression like amitriptyline, fluoxetine, sertraline Certain medications for seizures like phenobarbital, primidone Certain medications for stomach problems General anesthetics like halothane, isoflurane, methoxyflurane, propofol Local anesthetics like lidocaine, pramoxine, tetracaine Medications that relax muscles for surgery Opioid medications for pain Phenothiazines like chlorpromazine, mesoridazine, prochlorperazine, thioridazine This list may not describe all possible interactions. Give your health care provider a list of all the medicines, herbs, non-prescription drugs, or dietary supplements you use. Also tell them if you smoke, drink alcohol, or use illegal drugs. Some items may interact with your medicine. What should I watch for while using this medication? Visit your care team for regular checks on your progress. You may want to keep a record at home of how you feel your condition is responding to treatment. You may want to share this information with your care team at each visit. You should contact your care team if your seizures get worse or if you have any new types of seizures. Do not stop taking this medication or any of your seizure medications unless instructed by your care team. Stopping your medication suddenly can increase your seizures or their severity. This medication may cause serious skin reactions. They can happen weeks to  months after starting the medication. Contact your care team right away if you notice fevers or flu-like symptoms with a rash. The rash may be red or purple and then turn into blisters or peeling of the skin. Or, you might  notice a red rash with swelling of the face, lips or lymph nodes in your neck or under your arms. Wear a medical identification bracelet or chain if you are taking this medication for seizures. Carry a card that lists all your medications. This medication may affect your coordination, reaction time, or judgment. Do not drive or operate machinery until you know how this medication affects you. Sit up or stand slowly to reduce the risk of dizzy or fainting spells. Drinking alcohol with this medication can increase the risk of these side effects. Your mouth may get dry. Chewing sugarless gum or sucking hard candy, and drinking plenty of water may help. Watch for new or worsening thoughts of suicide or depression. This includes sudden changes in mood, behaviors, or thoughts. These changes can happen at any time but are more common in the beginning of treatment or after a change in dose. Call your care team right away if you experience these thoughts or worsening depression. If you become pregnant while using this medication, you may enroll in the [place] Antiepileptic Drug Pregnancy Registry by calling [phone]. This registry collects information about the safety of antiepileptic medication use during pregnancy. What side effects may I notice from receiving this medication? Side effects that you should report to your care team as soon as possible: Allergic reactions or angioedema--skin rash, itching, hives, swelling of the face, eyes, lips, tongue, arms, or legs, trouble swallowing or breathing Rash, fever, and swollen lymph nodes Thoughts of suicide or self harm, worsening mood, feelings of depression Trouble breathing Unusual changes in mood or behavior in children after use such as difficulty concentrating, hostility, or restlessness Side effects that usually do not require medical attention (report to your care team if they continue or are  bothersome): Dizziness Drowsiness Nausea Swelling of ankles, feet, or hands Vomiting This list may not describe all possible side effects. Call your doctor for medical advice about side effects. You may report side effects to FDA at 1-800-FDA-1088. Where should I keep my medication? Keep out of reach of children and pets. Store at room temperature between 15 and 30 degrees C (59 and 86 degrees F). Get rid of any unused medication after the expiration date. This medication may cause accidental overdose and death if taken by other adults, children, or pets. To get rid of medications that are no longer needed or have expired: Take the medication to a medication take-back program. Check with your pharmacy or law enforcement to find a location. If you cannot return the medication, check the label or package insert to see if the medication should be thrown out in the garbage or flushed down the toilet. If you are not sure, ask your care team. If it is safe to put it in the trash, empty the medication out of the container. Mix the medication with cat litter, dirt, coffee grounds, or other unwanted substance. Seal the mixture in a bag or container. Put it in the trash. NOTE: This sheet is a summary. It may not cover all possible information. If you have questions about this medicine, talk to your doctor, pharmacist, or health care provider.  2024 Elsevier/Gold Standard (2021-11-04 00:00:00)

## 2023-08-20 NOTE — Progress Notes (Signed)
  Subjective:  Patient ID: Rickey Gaskins Sr., male    DOB: 1970-08-07,  MRN: [id number]  Chief Complaint  Patient presents with   Foot Pain    Rm 11 Patient is here for callus on the left foot, was previously shaved (05/14/23 Patient states callus on left foot is tender tot he touch.        Rickey Naji Sr. is a 53 year old male with diabetes who presents with left foot pain and history of recurrent infections.  He states he still gets numbness to his feet as well as burning, itching sensation to his feet at nighttime which gabapentin  does help with.  He also states he tried to trim the callus on his big toes at times at the get thick.  He has difficulty trimming his toenails. Objective:    Physical Exam         General: AAO x3, NAD  Dermatological: Hyperkeratotic lesion noted left foot submetatarsal 4 as well as bilateral hallux without any underlying ulceration or drainage or signs of infection.  There is no open lesion identified of this area along the interspaces.  No Lefaive hypertrophic, dystrophic yellow, brown discoloration.  No edema, erythema. Vascular: Dorsalis Pedis artery and Posterior Tibial artery pedal pulses are 2/4 bilateral with immedate capillary fill time.  There is no pain with calf compression, swelling, warmth, erythema.   Neruologic: Grossly intact via light touch bilateral.   Musculoskeletal: Tenderness on the hyperkeratotic lesion.  There is no crepitation.  There is no other areas of discomfort.  Gait: Unassisted, Nonantalgic.    No images are attached to the encounter.    Results          Assessment:   Preulcerative lesion left foot, neuropathy  Plan:  Patient was evaluated and treated and all questions answered.  Assessment and Plan       Preulcerative lesion -Sharply debrided x 3 without any complications or bleeding.  Moisturizer, offloading.  Onychomycosis, fungal infection - As a courtesy debride the nails and complications of bleeding.   Ciclopirox .  Neuropathy - Fill gabapentin .  Discussed side effects.  Should he develop any side effects to let me know.  -Daily foot inspection  Return in about 3 months (around 11/20/2023), or if symptoms worsen or fail to improve.   [name] Fees DPM

## 2023-10-13 ENCOUNTER — Ambulatory Visit (INDEPENDENT_AMBULATORY_CARE_PROVIDER_SITE_OTHER): Admitting: Podiatry

## 2023-10-13 DIAGNOSIS — L539 Erythematous condition, unspecified: Secondary | ICD-10-CM

## 2023-10-13 DIAGNOSIS — M795 Residual foreign body in soft tissue: Secondary | ICD-10-CM

## 2023-10-13 MED ORDER — DOXYCYCLINE HYCLATE 100 MG PO TABS: 100.0000 mg | ORAL_TABLET | Freq: Two times a day (BID) | ORAL | 0 refills | Status: DC

## 2023-10-13 NOTE — Progress Notes (Signed)
 Subjective:  Patient ID: Rickey Gaskins Sr., male    DOB: 04/22/1970,  MRN: [id number]  Chief Complaint  Patient presents with   Callouses    Pt stated that he saw a previous dr who shaved the place down on his foot and he would like to have it done again     53 y.o. male presents with the above complaint.  Patient presents with complaint of left midfoot metal shaving that may have gone into shoes.  Is causing him a lot of discomfort he is a diabetic with last A1c of 7%.  Wanted to have it removed has not seen anyone else prior to seeing me.  He works a lot on his foot and wears a steel toe boots.  Pain scale is 5 out of 10 dull aching nature.  He is not experience any clinical signs of infection at this time.   Review of Systems: Negative except as noted in the HPI. Denies N/V/F/Ch.  Past Medical History:  Diagnosis Date   Hypertension     Current Outpatient Medications:    doxycycline  (VIBRA -TABS) 100 MG tablet, Take 1 tablet (100 mg total) by mouth 2 (two) times daily., Disp: 28 tablet, Rfl: 0   aspirin EC 81 MG tablet, Take 81 mg by mouth daily. Swallow whole., Disp: , Rfl:    atorvastatin (LIPITOR) 40 MG tablet, Take 40 mg by mouth daily., Disp: , Rfl:    baclofen (LIORESAL) 10 MG tablet, Take 10 mg by mouth daily., Disp: , Rfl:    baclofen (LIORESAL) 10 MG tablet, Take 10 mg by mouth 3 (three) times daily., Disp: , Rfl:    buPROPion (ZYBAN) 150 MG 12 hr tablet, Take 150 mg by mouth 2 (two) times daily., Disp: , Rfl:    cefUROXime  (CEFTIN ) 250 MG tablet, Take 1 tablet (250 mg total) by mouth 2 (two) times daily with a meal., Disp: 14 tablet, Rfl: 0   Ciclopirox  0.77 % gel, Apply 1 Application topically 2 (two) times daily., Disp: 45 g, Rfl: 2   ciprofloxacin  (CIPRO ) 500 MG tablet, Take 1 tablet (500 mg total) by mouth 2 (two) times daily., Disp: 20 tablet, Rfl: 0   gabapentin  (NEURONTIN ) 300 MG capsule, Take 1 capsule (300 mg total) by mouth 3 (three) times daily., Disp: 90 capsule,  Rfl: 3   hydrochlorothiazide (HYDRODIURIL) 12.5 MG tablet, Take 12.5 mg by mouth daily., Disp: , Rfl:    ibuprofen  (ADVIL ) 800 MG tablet, Take 1 tablet (800 mg total) by mouth every 8 (eight) hours as needed for mild pain., Disp: 30 tablet, Rfl: 0   losartan (COZAAR) 25 MG tablet, Take 25 mg by mouth daily., Disp: , Rfl:    metFORMIN (GLUCOPHAGE) 500 MG tablet, Take 500 mg by mouth 2 (two) times daily., Disp: , Rfl:    metoprolol  tartrate (LOPRESSOR ) 100 MG tablet, Take tablet (100mg ) TWO hours prior to your cardiac CT scan., Disp: 1 tablet, Rfl: 0   nitroGLYCERIN  (NITROSTAT ) 0.3 MG SL tablet, Place 1 tablet (0.3 mg total) under the tongue every 5 (five) minutes as needed for chest pain., Disp: 30 tablet, Rfl: 3   pantoprazole (PROTONIX) 40 MG tablet, Take 40 mg by mouth daily., Disp: , Rfl:    pravastatin (PRAVACHOL) 40 MG tablet, Take 40 mg by mouth at bedtime., Disp: , Rfl:   Social History   Tobacco Use  Smoking Status Every Day   Current packs/day: 0.50   Types: Cigarettes  Smokeless Tobacco Never    No  Known Allergies Objective:  There were no vitals filed for this visit. There is no height or weight on file to calculate BMI. Constitutional Well developed. Well nourished.  Vascular Dorsalis pedis pulses palpable bilaterally. Posterior tibial pulses palpable bilaterally. Capillary refill normal to all digits.  No cyanosis or clubbing noted. Pedal hair growth normal.  Neurologic Normal speech. Oriented to person, place, and time. Epicritic sensation to light touch grossly present bilaterally.  Dermatologic Left midfoot foreign body noted.  Small superficial purulent drainage noted no erythema noted.  No open wounds or lesion noted mild erythema noted  Orthopedic: Normal joint ROM without pain or crepitus bilaterally. No visible deformities. No bony tenderness.   Radiographs: None Assessment:   1. Erythema   2. Residual foreign body in soft tissue    Plan:  Patient was  evaluated and treated and all questions answered.  Left midfoot foreign body(metal shaving) - All questions and concerns were discussed with the patient extensive detail - Given that there is erythema present patient will benefit from doxycycline .  He is at high risk of undergoing deeper abscess clinically I am not able to appreciate any deeper abscess. -Excision of the foreign body Skin was prepped in standard technique with Betadine.  One-to-one mixture of 1% lidocaine  plain half percent Marcaine plain was injected circumferentially in V-block fashion 3 cc.  Using chisel blade to handle the foreign body was removed.  Superficial purulent drainage noted no deep abscess noted.  The wound site was dressed with triple antibiotic 4 x 4 gauze Kerlix and Ace bandage.  I have asked the patient to keep it covered with Neosporin and a Band-Aid until healing.     No follow-ups on file.

## 2023-10-14 ENCOUNTER — Ambulatory Visit: Admitting: Podiatry

## 2023-10-14 DIAGNOSIS — M795 Residual foreign body in soft tissue: Secondary | ICD-10-CM

## 2023-10-14 DIAGNOSIS — L539 Erythematous condition, unspecified: Secondary | ICD-10-CM

## 2023-10-14 MED ORDER — CLOTRIMAZOLE 1 % EX CREA: 1.0000 | TOPICAL_CREAM | Freq: Two times a day (BID) | CUTANEOUS | 0 refills | Status: DC

## 2023-10-14 MED ORDER — OXYCODONE-ACETAMINOPHEN 5-325 MG PO TABS: 1.0000 | ORAL_TABLET | ORAL | 0 refills | Status: DC | PRN

## 2023-10-14 NOTE — Progress Notes (Signed)
 No bleeding noted. No signs of infection.  At this time to help control his pain 1 time pain medication was sent to the pharmacy.  He states understanding

## 2024-03-05 ENCOUNTER — Telehealth: Admitting: Family

## 2024-03-05 DIAGNOSIS — L0291 Cutaneous abscess, unspecified: Secondary | ICD-10-CM

## 2024-03-05 MED ORDER — SULFAMETHOXAZOLE-TRIMETHOPRIM 800-160 MG PO TABS: 1.0000 | ORAL_TABLET | Freq: Two times a day (BID) | ORAL | 0 refills | Status: AC

## 2024-03-05 NOTE — Progress Notes (Signed)
 " Virtual Visit Consent   Rickey Garrels Sr., you are scheduled for a virtual visit with a [facility] provider today. Just as with appointments in the office, your consent must be obtained to participate. Your consent will be active for this visit and any virtual visit you may have with one of our providers in the next 365 days. If you have a MyChart account, a copy of this consent can be sent to you electronically.  As this is a virtual visit, video technology does not allow for your provider to perform a traditional examination. This may limit your provider's ability to fully assess your condition. If your provider identifies any concerns that need to be evaluated in person or the need to arrange testing (such as labs, EKG, etc.), we will make arrangements to do so. Although advances in technology are sophisticated, we cannot ensure that it will always work on either your end or our end. If the connection with a video visit is poor, the visit may have to be switched to a telephone visit. With either a video or telephone visit, we are not always able to ensure that we have a secure connection.  By engaging in this virtual visit, you consent to the provision of healthcare and authorize for your insurance to be billed (if applicable) for the services provided during this visit. Depending on your insurance coverage, you may receive a charge related to this service.  I need to obtain your verbal consent now. Are you willing to proceed with your visit today? [name] has provided verbal consent on 03/05/2024 for a virtual visit (video or telephone). [name], FNP  Date: 03/05/2024 5:26 PM   Virtual Visit via Video Note   I, [name], connected with  [name]  ([id number], 10/31/1970) on 03/05/24 at  5:15 PM EST by a video-enabled telemedicine application and verified that I am speaking with the correct person using two identifiers.  Location: Patient: Virtual Visit Location Patient:  Home Provider: Virtual Visit Location Provider: Home Office   I discussed the limitations of evaluation and management by telemedicine and the availability of in person appointments. The patient expressed understanding and agreed to proceed.    History of Present Illness: Rickey Villena Sr. is a 54 y.o. who identifies as a male who was assigned male at birth, and is being seen today for boil on testicles that he noticed two days. Reports area has become larger and more tender. Denies any fever or discharge.   HPI: HPI  Problems:  Patient Active Problem List   Diagnosis Date Noted   Chronic bilateral low back pain with left-sided sciatica 03/16/2019   Controlled type 2 diabetes mellitus without complication, without long-term current use of insulin (HCC) 03/16/2019   Essential hypertension 03/16/2019   Hyperlipidemia 03/16/2019    Allergies: Allergies[1] Medications: Current Medications[2]  Observations/Objective: Patient is well-developed, well-nourished in no acute distress.  Resting comfortably at home.  Head is normocephalic, atraumatic.  No labored breathing.  Speech is clear and coherent with logical content.  Patient is alert and oriented at baseline.  Dime size abscess on posterior scrotum, no erythemas or discharge noted   Assessment and Plan: 1. Abscess (Primary) - sulfamethoxazole -trimethoprim  (BACTRIM  DS) 800-160 MG tablet; Take 1 tablet by mouth 2 (two) times daily.  Dispense: 14 tablet; Refill: 0  Soak in hot baths  Use hot compresses Start bactrim   Tylenol  as needed for pain Follow up if symptoms worsen or do not improve  Follow Up Instructions: I discussed the assessment and treatment plan with the patient. The patient was provided an opportunity to ask questions and all were answered. The patient agreed with the plan and demonstrated an understanding of the instructions.  A copy of instructions were sent to the patient via MyChart unless otherwise noted below.      The patient was advised to call back or seek an in-person evaluation if the symptoms worsen or if the condition fails to improve as anticipated.    [name], FNP    [1] No Known Allergies [2]  Current Outpatient Medications:    sulfamethoxazole -trimethoprim  (BACTRIM  DS) 800-160 MG tablet, Take 1 tablet by mouth 2 (two) times daily., Disp: 14 tablet, Rfl: 0   aspirin EC 81 MG tablet, Take 81 mg by mouth daily. Swallow whole., Disp: , Rfl:    atorvastatin (LIPITOR) 40 MG tablet, Take 40 mg by mouth daily., Disp: , Rfl:    baclofen (LIORESAL) 10 MG tablet, Take 10 mg by mouth daily., Disp: , Rfl:    baclofen (LIORESAL) 10 MG tablet, Take 10 mg by mouth 3 (three) times daily., Disp: , Rfl:    buPROPion (ZYBAN) 150 MG 12 hr tablet, Take 150 mg by mouth 2 (two) times daily., Disp: , Rfl:    Ciclopirox  0.77 % gel, Apply 1 Application topically 2 (two) times daily., Disp: 45 g, Rfl: 2   gabapentin  (NEURONTIN ) 300 MG capsule, Take 1 capsule (300 mg total) by mouth 3 (three) times daily., Disp: 90 capsule, Rfl: 3   hydrochlorothiazide (HYDRODIURIL) 12.5 MG tablet, Take 12.5 mg by mouth daily., Disp: , Rfl:    ibuprofen  (ADVIL ) 800 MG tablet, Take 1 tablet (800 mg total) by mouth every 8 (eight) hours as needed for mild pain., Disp: 30 tablet, Rfl: 0   losartan (COZAAR) 25 MG tablet, Take 25 mg by mouth daily., Disp: , Rfl:    metFORMIN (GLUCOPHAGE) 500 MG tablet, Take 500 mg by mouth 2 (two) times daily., Disp: , Rfl:    metoprolol  tartrate (LOPRESSOR ) 100 MG tablet, Take tablet (100mg ) TWO hours prior to your cardiac CT scan., Disp: 1 tablet, Rfl: 0   nitroGLYCERIN  (NITROSTAT ) 0.3 MG SL tablet, Place 1 tablet (0.3 mg total) under the tongue every 5 (five) minutes as needed for chest pain., Disp: 30 tablet, Rfl: 3   pantoprazole (PROTONIX) 40 MG tablet, Take 40 mg by mouth daily., Disp: , Rfl:    pravastatin (PRAVACHOL) 40 MG tablet, Take 40 mg by mouth at bedtime., Disp: , Rfl:   "
